# Patient Record
Sex: Female | Born: 1960 | Race: White | Hispanic: No | Marital: Married | Smoking: Former smoker
Health system: Southern US, Community
[De-identification: ages and names within clinical notes are randomized; demographics above are authoritative.]

## PROBLEM LIST (undated history)

## (undated) DIAGNOSIS — E785 Hyperlipidemia, unspecified: Secondary | ICD-10-CM

## (undated) DIAGNOSIS — Z8719 Personal history of other diseases of the digestive system: Secondary | ICD-10-CM

## (undated) DIAGNOSIS — T7840XA Allergy, unspecified, initial encounter: Secondary | ICD-10-CM

## (undated) DIAGNOSIS — F32A Depression, unspecified: Secondary | ICD-10-CM

## (undated) DIAGNOSIS — H269 Unspecified cataract: Secondary | ICD-10-CM

## (undated) DIAGNOSIS — F319 Bipolar disorder, unspecified: Secondary | ICD-10-CM

## (undated) DIAGNOSIS — F17200 Nicotine dependence, unspecified, uncomplicated: Secondary | ICD-10-CM

## (undated) DIAGNOSIS — F419 Anxiety disorder, unspecified: Secondary | ICD-10-CM

## (undated) DIAGNOSIS — F329 Major depressive disorder, single episode, unspecified: Secondary | ICD-10-CM

## (undated) HISTORY — DX: Anxiety disorder, unspecified: F41.9

## (undated) HISTORY — DX: Unspecified cataract: H26.9

## (undated) HISTORY — DX: Depression, unspecified: F32.A

## (undated) HISTORY — DX: Personal history of other diseases of the digestive system: Z87.19

## (undated) HISTORY — PX: POLYPECTOMY: SHX149

## (undated) HISTORY — DX: Allergy, unspecified, initial encounter: T78.40XA

## (undated) HISTORY — DX: Hyperlipidemia, unspecified: E78.5

## (undated) HISTORY — PX: EYELID LACERATION REPAIR: SHX1564

## (undated) HISTORY — DX: Major depressive disorder, single episode, unspecified: F32.9

## (undated) HISTORY — DX: Bipolar disorder, unspecified: F31.9

## (undated) HISTORY — DX: Nicotine dependence, unspecified, uncomplicated: F17.200

---
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
## 1991-08-05 HISTORY — PX: TUBAL LIGATION: SHX77

## 2006-03-09 ENCOUNTER — Ambulatory Visit: Payer: Self-pay | Admitting: Gynecology

## 2009-08-04 LAB — HM MAMMOGRAPHY

## 2010-06-04 LAB — HM PAP SMEAR

## 2010-08-04 HISTORY — PX: COLONOSCOPY: SHX174

## 2010-10-07 ENCOUNTER — Inpatient Hospital Stay (INDEPENDENT_AMBULATORY_CARE_PROVIDER_SITE_OTHER)
Admission: RE | Admit: 2010-10-07 | Discharge: 2010-10-07 | Disposition: A | Discharge status: Home / Self Care | Payer: Medicaid Other | Source: Ambulatory Visit | Attending: Family Medicine | Admitting: Family Medicine

## 2010-10-07 DIAGNOSIS — J069 Acute upper respiratory infection, unspecified: Secondary | ICD-10-CM

## 2011-01-28 ENCOUNTER — Encounter: Payer: Self-pay | Admitting: Gastroenterology

## 2011-02-07 ENCOUNTER — Ambulatory Visit (INDEPENDENT_AMBULATORY_CARE_PROVIDER_SITE_OTHER): Payer: Medicaid Other

## 2011-02-07 ENCOUNTER — Inpatient Hospital Stay (INDEPENDENT_AMBULATORY_CARE_PROVIDER_SITE_OTHER)
Admission: RE | Admit: 2011-02-07 | Discharge: 2011-02-07 | Disposition: A | Discharge status: Home / Self Care | Payer: Medicaid Other | Source: Ambulatory Visit | Attending: Family Medicine | Admitting: Family Medicine

## 2011-02-07 DIAGNOSIS — S335XXA Sprain of ligaments of lumbar spine, initial encounter: Secondary | ICD-10-CM

## 2011-02-28 ENCOUNTER — Encounter: Payer: Self-pay | Admitting: Family Medicine

## 2011-03-03 ENCOUNTER — Other Ambulatory Visit: Payer: Medicaid Other | Admitting: Gastroenterology

## 2011-03-24 ENCOUNTER — Encounter: Payer: Self-pay | Admitting: Gastroenterology

## 2011-04-04 ENCOUNTER — Other Ambulatory Visit: Payer: Medicaid Other | Admitting: Gastroenterology

## 2011-04-10 ENCOUNTER — Ambulatory Visit (AMBULATORY_SURGERY_CENTER): Payer: Medicaid Other | Admitting: *Deleted

## 2011-04-10 VITALS — Ht 62.0 in | Wt 141.0 lb

## 2011-04-10 DIAGNOSIS — Z1211 Encounter for screening for malignant neoplasm of colon: Secondary | ICD-10-CM

## 2011-04-10 MED ORDER — SUPREP BOWEL PREP KIT 17.5-3.13-1.6 GM/177ML PO SOLN: 1.0000 | ORAL | Status: DC

## 2011-04-18 ENCOUNTER — Encounter: Payer: Medicaid Other | Admitting: Gastroenterology

## 2011-04-24 ENCOUNTER — Other Ambulatory Visit: Payer: Medicaid Other | Admitting: Gastroenterology

## 2011-04-24 ENCOUNTER — Encounter: Payer: Medicaid Other | Admitting: Gastroenterology

## 2011-04-29 ENCOUNTER — Ambulatory Visit (AMBULATORY_SURGERY_CENTER): Payer: Medicaid Other | Admitting: Gastroenterology

## 2011-04-29 ENCOUNTER — Encounter: Payer: Self-pay | Admitting: Gastroenterology

## 2011-04-29 VITALS — BP 117/70 | HR 51 | Temp 97.5°F | Resp 13 | Ht 66.0 in | Wt 141.0 lb

## 2011-04-29 DIAGNOSIS — D126 Benign neoplasm of colon, unspecified: Secondary | ICD-10-CM

## 2011-04-29 DIAGNOSIS — Z1211 Encounter for screening for malignant neoplasm of colon: Secondary | ICD-10-CM

## 2011-04-29 MED ORDER — SODIUM CHLORIDE 0.9 % IV SOLN: 500.0000 mL | INTRAVENOUS | Status: DC

## 2011-04-29 NOTE — Patient Instructions (Signed)
Please review discharge instructions (blue and green sheets)  Review information about polyps  Resume your normal medications

## 2011-04-29 NOTE — Progress Notes (Signed)
Propofol was administered to the pt by Brennan Bailey, CRNA.  Maw  Pt tolerated the colonoscopy very well. maw

## 2011-04-30 ENCOUNTER — Telehealth: Payer: Self-pay | Admitting: *Deleted

## 2011-04-30 NOTE — Telephone Encounter (Signed)
Left message to call if needed. 

## 2011-05-29 LAB — HM COLONOSCOPY

## 2011-06-13 ENCOUNTER — Emergency Department (HOSPITAL_COMMUNITY)
Admission: EM | Admit: 2011-06-13 | Discharge: 2011-06-13 | Disposition: A | Discharge status: Home / Self Care | Payer: No Typology Code available for payment source | Attending: Emergency Medicine | Admitting: Emergency Medicine

## 2011-06-13 ENCOUNTER — Encounter (HOSPITAL_COMMUNITY): Payer: Self-pay | Admitting: *Deleted

## 2011-06-13 ENCOUNTER — Emergency Department (HOSPITAL_COMMUNITY): Payer: No Typology Code available for payment source

## 2011-06-13 DIAGNOSIS — M545 Low back pain, unspecified: Secondary | ICD-10-CM | POA: Insufficient documentation

## 2011-06-13 DIAGNOSIS — H409 Unspecified glaucoma: Secondary | ICD-10-CM | POA: Insufficient documentation

## 2011-06-13 DIAGNOSIS — E785 Hyperlipidemia, unspecified: Secondary | ICD-10-CM | POA: Insufficient documentation

## 2011-06-13 DIAGNOSIS — F341 Dysthymic disorder: Secondary | ICD-10-CM | POA: Insufficient documentation

## 2011-06-13 DIAGNOSIS — F319 Bipolar disorder, unspecified: Secondary | ICD-10-CM | POA: Insufficient documentation

## 2011-06-13 DIAGNOSIS — Z79899 Other long term (current) drug therapy: Secondary | ICD-10-CM | POA: Insufficient documentation

## 2011-06-13 DIAGNOSIS — S335XXA Sprain of ligaments of lumbar spine, initial encounter: Secondary | ICD-10-CM | POA: Insufficient documentation

## 2011-06-13 MED ORDER — IBUPROFEN 800 MG PO TABS: 800.0000 mg | ORAL_TABLET | Freq: Three times a day (TID) | ORAL | Status: AC | PRN

## 2011-06-13 MED ORDER — TRAMADOL HCL 50 MG PO TABS: 50.0000 mg | ORAL_TABLET | Freq: Four times a day (QID) | ORAL | Status: DC | PRN

## 2011-06-13 MED ORDER — OXYCODONE-ACETAMINOPHEN 5-325 MG PO TABS
1.0000 | ORAL_TABLET | Freq: Once | ORAL | Status: AC
  Administered 2011-06-13: 1 via ORAL
  Filled 2011-06-13: qty 1

## 2011-06-13 MED ORDER — DIAZEPAM 5 MG PO TABS: 5.0000 mg | ORAL_TABLET | Freq: Three times a day (TID) | ORAL | Status: AC | PRN

## 2011-06-13 MED ORDER — TRAMADOL HCL 50 MG PO TABS: 50.0000 mg | ORAL_TABLET | Freq: Four times a day (QID) | ORAL | Status: AC | PRN
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
## 2011-06-13 NOTE — ED Provider Notes (Signed)
Evaluation and management procedures were performed by the PA/NP under my supervision/collaboration.   Janet Humphreys, MD 06/13/11 2023 
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
## 2011-06-30 ENCOUNTER — Emergency Department (HOSPITAL_COMMUNITY)
Admission: EM | Admit: 2011-06-30 | Discharge: 2011-07-01 | Disposition: A | Discharge status: Home / Self Care | Payer: No Typology Code available for payment source | Attending: Emergency Medicine | Admitting: Emergency Medicine

## 2011-06-30 ENCOUNTER — Encounter (HOSPITAL_COMMUNITY): Payer: Self-pay | Admitting: Emergency Medicine

## 2011-06-30 DIAGNOSIS — S139XXA Sprain of joints and ligaments of unspecified parts of neck, initial encounter: Secondary | ICD-10-CM | POA: Insufficient documentation

## 2011-06-30 DIAGNOSIS — M549 Dorsalgia, unspecified: Secondary | ICD-10-CM | POA: Insufficient documentation

## 2011-06-30 DIAGNOSIS — Z79899 Other long term (current) drug therapy: Secondary | ICD-10-CM | POA: Insufficient documentation

## 2011-06-30 DIAGNOSIS — H409 Unspecified glaucoma: Secondary | ICD-10-CM | POA: Insufficient documentation

## 2011-06-30 DIAGNOSIS — T148XXA Other injury of unspecified body region, initial encounter: Secondary | ICD-10-CM

## 2011-06-30 DIAGNOSIS — E785 Hyperlipidemia, unspecified: Secondary | ICD-10-CM | POA: Insufficient documentation

## 2011-06-30 DIAGNOSIS — F3130 Bipolar disorder, current episode depressed, mild or moderate severity, unspecified: Secondary | ICD-10-CM | POA: Insufficient documentation
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
## 2011-07-01 MED ORDER — OXYCODONE-ACETAMINOPHEN 5-325 MG PO TABS
1.0000 | ORAL_TABLET | Freq: Once | ORAL | Status: AC
  Administered 2011-07-01: 1 via ORAL
  Filled 2011-07-01: qty 1

## 2011-07-01 MED ORDER — NAPROXEN 375 MG PO TABS: 375.0000 mg | ORAL_TABLET | Freq: Two times a day (BID) | ORAL | Status: DC

## 2011-07-01 MED ORDER — DIAZEPAM 5 MG PO TABS
5.0000 mg | ORAL_TABLET | Freq: Once | ORAL | Status: AC
  Administered 2011-07-01: 5 mg via ORAL
  Filled 2011-07-01: qty 1

## 2011-07-01 MED ORDER — IBUPROFEN 200 MG PO TABS
400.0000 mg | ORAL_TABLET | Freq: Once | ORAL | Status: AC
  Administered 2011-07-01: 400 mg via ORAL
  Filled 2011-07-01: qty 2

## 2011-07-01 MED ORDER — CYCLOBENZAPRINE HCL 10 MG PO TABS: 5.0000 mg | ORAL_TABLET | Freq: Three times a day (TID) | ORAL | Status: AC | PRN
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
## 2011-09-08 ENCOUNTER — Emergency Department (HOSPITAL_COMMUNITY): Payer: Medicaid Other

## 2011-09-08 ENCOUNTER — Encounter (HOSPITAL_COMMUNITY): Payer: Self-pay | Admitting: Emergency Medicine

## 2011-09-08 ENCOUNTER — Emergency Department (HOSPITAL_COMMUNITY)
Admission: EM | Admit: 2011-09-08 | Discharge: 2011-09-08 | Disposition: A | Discharge status: Home / Self Care | Payer: Medicaid Other | Attending: Emergency Medicine | Admitting: Emergency Medicine

## 2011-09-08 DIAGNOSIS — M7989 Other specified soft tissue disorders: Secondary | ICD-10-CM | POA: Insufficient documentation

## 2011-09-08 DIAGNOSIS — S93609A Unspecified sprain of unspecified foot, initial encounter: Secondary | ICD-10-CM | POA: Insufficient documentation

## 2011-09-08 DIAGNOSIS — F319 Bipolar disorder, unspecified: Secondary | ICD-10-CM | POA: Insufficient documentation

## 2011-09-08 DIAGNOSIS — F17200 Nicotine dependence, unspecified, uncomplicated: Secondary | ICD-10-CM | POA: Insufficient documentation

## 2011-09-08 DIAGNOSIS — M79609 Pain in unspecified limb: Secondary | ICD-10-CM | POA: Insufficient documentation

## 2011-09-08 DIAGNOSIS — R51 Headache: Secondary | ICD-10-CM | POA: Insufficient documentation

## 2011-09-08 DIAGNOSIS — X500XXA Overexertion from strenuous movement or load, initial encounter: Secondary | ICD-10-CM | POA: Insufficient documentation

## 2011-09-08 DIAGNOSIS — S93602A Unspecified sprain of left foot, initial encounter: Secondary | ICD-10-CM

## 2011-09-08 DIAGNOSIS — E785 Hyperlipidemia, unspecified: Secondary | ICD-10-CM | POA: Insufficient documentation

## 2011-09-08 DIAGNOSIS — W010XXA Fall on same level from slipping, tripping and stumbling without subsequent striking against object, initial encounter: Secondary | ICD-10-CM | POA: Insufficient documentation

## 2011-09-08 DIAGNOSIS — H409 Unspecified glaucoma: Secondary | ICD-10-CM | POA: Insufficient documentation

## 2011-09-08 MED ORDER — ONDANSETRON 4 MG PO TBDP
8.0000 mg | ORAL_TABLET | Freq: Once | ORAL | Status: AC
  Administered 2011-09-08: 8 mg via ORAL
  Filled 2011-09-08: qty 2

## 2011-09-08 MED ORDER — OXYCODONE-ACETAMINOPHEN 5-325 MG PO TABS
1.0000 | ORAL_TABLET | Freq: Once | ORAL | Status: AC
  Administered 2011-09-08: 1 via ORAL
  Filled 2011-09-08: qty 1

## 2011-09-08 MED ORDER — ONDANSETRON 8 MG PO TBDP: 8.0000 mg | ORAL_TABLET | Freq: Once | ORAL | Status: AC

## 2011-09-08 MED ORDER — OXYCODONE-ACETAMINOPHEN 5-325 MG PO TABS: 1.0000 | ORAL_TABLET | Freq: Once | ORAL | Status: AC

## 2011-09-08 NOTE — ED Notes (Signed)
Pt given an ice pack. Paged ortho

## 2011-09-08 NOTE — Progress Notes (Signed)
Orthopedic Tech Progress Note Patient Details:  Natalie [name] 23-Aug-1960 [id number]  Other Ortho Devices Type of Ortho Device: Crutches Ortho Device Location: ace wrap foot Ortho Device Interventions: Application;Adjustment   Shawnie Pons 09/08/2011, 8:27 AM
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
## 2011-09-08 NOTE — ED Notes (Signed)
Patient fell into a hole last night, outside and now having right foot pain.  Swelling noted to top of foot.

## 2011-09-08 NOTE — ED Notes (Signed)
Ortho returned page, coming down to see pt

## 2011-09-08 NOTE — ED Notes (Signed)
At discharge pt alert and oriented. Pt using crutches, ace wrap applied by ortho tech. Pt wheeled out by NT

## 2011-09-08 NOTE — ED Provider Notes (Signed)
Medical screening examination/treatment/procedure(s) were performed by non-physician practitioner and as supervising physician I was immediately available for consultation/collaboration.  Doug Sou, MD 09/08/11 [phone]

## 2011-09-08 NOTE — ED Notes (Signed)
Patient states twisted foot last night during the [place] game but it did not hurt very much until it woke her up at 0130 this am. States unable to put any weight on her foot to walk. Able to move toes swelling noted to top of foot but states pain is worse on the bottem of foot. Rates pain 10/10

## 2012-03-27 ENCOUNTER — Emergency Department (INDEPENDENT_AMBULATORY_CARE_PROVIDER_SITE_OTHER)
Admission: EM | Admit: 2012-03-27 | Discharge: 2012-03-27 | Disposition: A | Discharge status: Nursing Home (Includes ICF) | Payer: Medicaid Other | Source: Home / Self Care | Attending: Emergency Medicine | Admitting: Emergency Medicine

## 2012-03-27 ENCOUNTER — Encounter (HOSPITAL_COMMUNITY): Payer: Self-pay | Admitting: Emergency Medicine

## 2012-03-27 ENCOUNTER — Observation Stay (HOSPITAL_COMMUNITY)
Admission: EM | Admit: 2012-03-27 | Discharge: 2012-03-28 | Disposition: A | Discharge status: Home / Self Care | Payer: Medicaid Other | Attending: Emergency Medicine | Admitting: Emergency Medicine

## 2012-03-27 ENCOUNTER — Encounter (HOSPITAL_COMMUNITY): Payer: Self-pay | Admitting: Family Medicine

## 2012-03-27 DIAGNOSIS — L0291 Cutaneous abscess, unspecified: Secondary | ICD-10-CM

## 2012-03-27 DIAGNOSIS — F319 Bipolar disorder, unspecified: Secondary | ICD-10-CM | POA: Insufficient documentation

## 2012-03-27 DIAGNOSIS — L02419 Cutaneous abscess of limb, unspecified: Secondary | ICD-10-CM

## 2012-03-27 DIAGNOSIS — F17200 Nicotine dependence, unspecified, uncomplicated: Secondary | ICD-10-CM | POA: Insufficient documentation

## 2012-03-27 DIAGNOSIS — IMO0002 Reserved for concepts with insufficient information to code with codable children: Principal | ICD-10-CM | POA: Insufficient documentation

## 2012-03-27 DIAGNOSIS — E785 Hyperlipidemia, unspecified: Secondary | ICD-10-CM | POA: Insufficient documentation

## 2012-03-27 DIAGNOSIS — L0390 Cellulitis, unspecified: Secondary | ICD-10-CM

## 2012-03-27 LAB — COMPREHENSIVE METABOLIC PANEL
ALT: 20 U/L (ref 0–35)
AST: 27 U/L (ref 0–37)
Albumin: 3.6 g/dL (ref 3.5–5.2)
Alkaline Phosphatase: 97 U/L (ref 39–117)
BUN: 8 mg/dL (ref 6–23)
CO2: 28 mEq/L (ref 19–32)
Calcium: 9.8 mg/dL (ref 8.4–10.5)
Chloride: 99 mEq/L (ref 96–112)
Creatinine, Ser: 0.87 mg/dL (ref 0.50–1.10)
GFR calc Af Amer: 87 mL/min — ABNORMAL LOW (ref >90)
GFR calc non Af Amer: 75 mL/min — ABNORMAL LOW (ref >90)
Glucose, Bld: 76 mg/dL (ref 70–99)
Potassium: 3 mEq/L — ABNORMAL LOW (ref 3.5–5.1)
Sodium: 139 mEq/L (ref 135–145)
Total Bilirubin: 0.2 mg/dL — ABNORMAL LOW (ref 0.3–1.2)
Total Protein: 7.4 g/dL (ref 6.0–8.3)

## 2012-03-27 LAB — CBC WITH DIFFERENTIAL/PLATELET
Basophils Absolute: 0 10*3/uL (ref 0.0–0.1)
Basophils Relative: 0 % (ref 0–1)
Eosinophils Absolute: 0.2 10*3/uL (ref 0.0–0.7)
Eosinophils Relative: 2 % (ref 0–5)
HCT: 35.5 % — ABNORMAL LOW (ref 36.0–46.0)
Hemoglobin: 12.3 g/dL (ref 12.0–15.0)
Lymphocytes Relative: 21 % (ref 12–46)
Lymphs Abs: 3 10*3/uL (ref 0.7–4.0)
MCH: 32.5 pg (ref 26.0–34.0)
MCHC: 34.6 g/dL (ref 30.0–36.0)
MCV: 93.7 fL (ref 78.0–100.0)
Monocytes Absolute: 1 10*3/uL (ref 0.1–1.0)
Monocytes Relative: 7 % (ref 3–12)
Neutro Abs: 10.2 10*3/uL — ABNORMAL HIGH (ref 1.7–7.7)
Neutrophils Relative %: 71 % (ref 43–77)
Platelets: 239 10*3/uL (ref 150–400)
RBC: 3.79 MIL/uL — ABNORMAL LOW (ref 3.87–5.11)
RDW: 12.9 % (ref 11.5–15.5)
WBC: 14.4 10*3/uL — ABNORMAL HIGH (ref 4.0–10.5)

## 2012-03-27 MED ORDER — CLINDAMYCIN PHOSPHATE 600 MG/50ML IV SOLN
600.0000 mg | Freq: Three times a day (TID) | INTRAVENOUS | Status: DC
  Administered 2012-03-28 (×2): 600 mg via INTRAVENOUS
  Filled 2012-03-27 (×2): qty 50

## 2012-03-27 MED ORDER — CLINDAMYCIN PHOSPHATE 900 MG/50ML IV SOLN
900.0000 mg | Freq: Once | INTRAVENOUS | Status: AC
  Administered 2012-03-27: 900 mg via INTRAVENOUS
  Filled 2012-03-27: qty 50

## 2012-03-27 MED ORDER — ACETAMINOPHEN 325 MG PO TABS: 650.0000 mg | ORAL_TABLET | ORAL | Status: DC | PRN

## 2012-03-27 MED ORDER — QUETIAPINE FUMARATE 50 MG PO TABS: 100.0000 mg | ORAL_TABLET | Freq: Every day | ORAL | Status: DC

## 2012-03-27 MED ORDER — POTASSIUM CHLORIDE CRYS ER 20 MEQ PO TBCR
40.0000 meq | EXTENDED_RELEASE_TABLET | Freq: Once | ORAL | Status: AC
  Administered 2012-03-27: 40 meq via ORAL
  Filled 2012-03-27: qty 2

## 2012-03-27 MED ORDER — CLINDAMYCIN PHOSPHATE 600 MG/50ML IV SOLN: 600.0000 mg | Freq: Three times a day (TID) | INTRAVENOUS | Status: DC

## 2012-03-27 MED ORDER — SIMVASTATIN 20 MG PO TABS
20.0000 mg | ORAL_TABLET | Freq: Every day | ORAL | Status: DC
  Filled 2012-03-27 (×2): qty 1

## 2012-03-27 NOTE — ED Provider Notes (Signed)
No chief complaint on file.   History of Present Illness:    Natalie [name] is a 51 year old female who has had a ten-day history of a painful boil in her right axilla. She saw her primary care physician at [facility] this past Monday, 6 days ago. At that time the boil was incised and drained, but no pus resulted. A culture was obtained, but were unable to get the results right now. She was placed on doxycycline 100 mg twice a day. Ever since then the pain has been getting worse. There is swelling and redness and she has trouble moving her right arm. The swelling and erythema extends to lateral chest wall and to her breast. She's felt feverish and chilled. She's never had anything like this before. No history of MRSA or diabetes. She does have bipolar disorder and hyperlipidemia. She's on medication for that. She is allergic to Septra.  Review of Systems:  Other than noted above, the patient denies any of the following symptoms: Systemic:  No fever, chills or sweats. Skin:  No rash or itching.  PMFSH:  Past medical history, family history, social history, meds, and allergies were reviewed.  No history of diabetes or prior history of abscesses or MRSA.  Physical Exam:   Vital signs:  BP 111/54  Pulse 80  Temp 98.4 F (36.9 C) (Oral)  Resp 17  SpO2 98%  LMP 11/16/2009 Skin:  She has a healed I&D in her right axilla. There is diffuse erythema and swelling extending from the axilla into the upper arm, down onto the lateral chest wall area and into the right breast. This is all swollen, and tender, and erythematous. There is no localized area of fluctuance and no drainage.  Skin exam was otherwise normal.  No rash. Ext:  Distal pulses were full, patient has full ROM of all joints. Except that she has had a decrease in abduction of her shoulder.  Assessment:  The primary encounter diagnosis was Abscess. A diagnosis of Cellulitis was also pertinent to this visit. This patient has extensive  cellulitis extending to the lateral chest wall, the upper arm, and even into the breast. She has failed outpatient management including incision and drainage and antibiotics. At this point I believe she needs hospital evaluation and treatment.  Plan:   1.  The following meds were prescribed:   New Prescriptions   No medications on file   2.  The patient was transported to the emergency department via shuttle.  Reuben Likes, MD 03/27/12 [phone]

## 2012-03-27 NOTE — ED Notes (Signed)
I gave the patient a cup of ice, a coke, two containers of apple sauce, and two packs of graham crackers.

## 2012-03-27 NOTE — ED Provider Notes (Signed)
Assumed pt care to CDU for cellulitis protocol.  51 year old female who presents to PMD office last week for evaluation of abscess to her R axillary but had a dry tap.  Pt was placed on doxy.  Pt is here with increase redness consistent with R axillary cellulitis.  Dr. Silverio Lay performed I&D and able to evacuate a moderate amount of pustular discharge.  Pt will receive IV clindamycin.  Plan to reassess tomorrow morning.  If not better, consider admission.    8:10 PM On evaluation, pt has dressing to R axillary region, ttp.  Mild erythema streaks extending toward lateral aspect of R breast with mild warmth.  Pt appears non toxic, currently tolerating PO.  Will continue cellulitis protocol.    10:35 PM Pt request nightly medication including seroquel and pravacol, will order.  11:45 PM Dr. Ranae Palms is aware of plan and will continue to monitor over night.  Pt to be reassess in AM, if better will d/c with clindamycin and appropriate follow up.    Fayrene Helper, PA-C 03/27/12 2346

## 2012-03-27 NOTE — ED Notes (Signed)
I gave the patient a cup of ice and a coke. 

## 2012-03-27 NOTE — ED Notes (Signed)
I gave the patient a warm blanket. 

## 2012-03-27 NOTE — ED Notes (Signed)
Extremity Restriction band placed on right arm.

## 2012-03-27 NOTE — ED Notes (Signed)
Pt sts area under right arm that was lanced by her doctor the other day. sts the abcess has gotten better but now there is a large red area around the abscess moving under her right breast

## 2012-03-27 NOTE — ED Notes (Signed)
DELAY SECONDARY TO ASSIGNMENT ACUITY

## 2012-03-27 NOTE — ED Notes (Signed)
Dr. Silverio Lay at bedside performing I&D of R axillary abscess. Pt tolerating without difficulty at the time.

## 2012-03-27 NOTE — ED Notes (Signed)
I gave the patient a happy meal. 

## 2012-03-27 NOTE — ED Notes (Signed)
SEEN ON Monday FOR ABSCESS RIGHT AXILLA.  TREATED WITH DOXYCYCLINE

## 2012-03-27 NOTE — ED Provider Notes (Signed)
History  This chart was scribed for Natalie Canal, MD by Erskine Emery. This patient was seen in room TR08C/TR08C and the patient's care was started at 17:15.   CSN: [id number]  Arrival date & time 03/27/12  1616   None     Chief Complaint  Patient presents with  . Abscess    (Consider location/radiation/quality/duration/timing/severity/associated sxs/prior treatment) The history is provided by the patient. No language interpreter was used.  Jackquelyn Sundberg is a 51 y.o. female who presents to the Emergency Department complaining of a painful abscess under the right axilla for the past week. Pt reports she saw her PCP (Dr. Tanya Nones) on Monday for the same complaint and was treated with incision and drainage and doxycyclin. Then the pt went to urgent care today because her symptoms did not decrease and she was told to come here.  Pt has no h/o similar symptoms.  Past Medical History  Diagnosis Date  . Bipolar 1 disorder   . Smoker   . Allergy     seasonal  . Anxiety     bipolar  . Depression   . Glaucoma     stage 2  . Hyperlipidemia     Past Surgical History  Procedure Date  . Tubal ligation 1993  . Cosmetic surgery 1967    face after accident    Family History  Problem Relation Age of Onset  . Colon cancer Maternal Grandfather     History  Substance Use Topics  . Smoking status: Current Everyday Smoker -- 1.0 packs/day    Types: Cigarettes  . Smokeless tobacco: Never Used  . Alcohol Use: No    OB History    Grav Para Term Preterm Abortions TAB SAB Ect Mult Living                  Review of Systems  Constitutional: Negative for fever and chills.  Respiratory: Negative for shortness of breath.   Gastrointestinal: Negative for nausea and vomiting.  Musculoskeletal:       Abscess under right breast  Skin: Positive for wound.  Neurological: Negative for weakness.    Allergies  Codeine; Hydrocodone; and Sulfa antibiotics  Home Medications   Current  Outpatient Rx  Name Route Sig Dispense Refill  . ALPRAZOLAM 1 MG PO TABS Oral Take 1 mg by mouth 2 (two) times daily as needed. For anxiety    . LAMOTRIGINE 100 MG PO TABS Oral Take 100 mg by mouth daily.     . CENTRUM SILVER ULTRA WOMENS PO TABS Oral Take 1 tablet by mouth daily.      Marland Kitchen PAROXETINE HCL 20 MG PO TABS Oral Take 20 mg by mouth every morning.      Marland Kitchen PRAVASTATIN SODIUM 40 MG PO TABS Oral Take 40 mg by mouth every evening.     . PSYLLIUM 58.6 % PO PACK Oral Take 1 packet by mouth daily.      . QUETIAPINE FUMARATE 100 MG PO TABS Oral Take 100-200 mg by mouth at bedtime. May take 1-2 if needed for sleep      BP 109/55  Pulse 80  Temp 98.5 F (36.9 C) (Oral)  Resp 18  SpO2 96%  LMP 11/16/2009  Physical Exam  Nursing note and vitals reviewed. Constitutional: She is oriented to person, place, and time. She appears well-developed and well-nourished. No distress.  HENT:  Head: Normocephalic and atraumatic.  Eyes: EOM are normal.  Neck: Neck supple. No tracheal deviation present.  Cardiovascular:  Normal rate.   Pulmonary/Chest: Effort normal. No respiratory distress.         Right axilla: a fluctuant mass with over lying cellulitis that spreads to right breast area.   Musculoskeletal: Normal range of motion.  Neurological: She is alert and oriented to person, place, and time.  Skin: Skin is warm and dry.  Psychiatric: She has a normal mood and affect. Her behavior is normal.    ED Course  Procedures (including critical care time) DIAGNOSTIC STUDIES: Oxygen Saturation is 96% on room air, adequate by my interpretation.    COORDINATION OF CARE: 17:38--I evaluated the patient and we discussed a treatment plan including ultrasound, IV antibiotics, labs, and possible admission for monitoring to which the pt agreed.   18:15--I performed the ultrasound.  18:55--I performed incision and drainage.  INCISION AND DRAINAGE Performed by: Silverio Lay, Keean Wilmeth Consent: Verbal consent  obtained. Risks and benefits: risks, benefits and alternatives were discussed Type: abscess  Body area: R axilla  Anesthesia: local infiltration  Local anesthetic: lidocaine 2% with epinephrine  Anesthetic total: 15 ml  Complexity: complex Blunt dissection to break up loculations  Drainage: purulent  Drainage amount: 4cc  Packing material: 1/4 in plain gauze  Patient tolerance: Patient tolerated the procedure well with no immediate complications.     Labs Reviewed  CBC WITH DIFFERENTIAL - Abnormal; Notable for the following:    WBC 14.4 (*)     RBC 3.79 (*)     HCT 35.5 (*)     Neutro Abs 10.2 (*)     All other components within normal limits  COMPREHENSIVE METABOLIC PANEL - Abnormal; Notable for the following:    Potassium 3.0 (*)     Total Bilirubin 0.2 (*)     GFR calc non Af Amer 75 (*)     GFR calc Af Amer 87 (*)     All other components within normal limits   No results found.   No diagnosis found.    MDM  Nate Perri is a 51 y.o. female here with R axillary abscess s/p I&D. She will benefit from short course of IV abx. Given clinda IV. WBC 14, CMP showed K 3.0. Care transferred to CDU PA.    This document was completed by the scribe at my direction and I have reviewed its accuracy. I have personally examined the patient and agrees with the above document.   Chaney Malling, MD     Natalie Canal, MD 03/27/12 [phone]

## 2012-03-28 MED ORDER — HYDROCODONE-ACETAMINOPHEN 5-325 MG PO TABS: 1.0000 | ORAL_TABLET | ORAL | Status: DC | PRN

## 2012-03-28 MED ORDER — CLINDAMYCIN HCL 300 MG PO CAPS: 300.0000 mg | ORAL_CAPSULE | Freq: Four times a day (QID) | ORAL | Status: DC

## 2012-03-28 MED ORDER — PROMETHAZINE HCL 25 MG RE SUPP: 25.0000 mg | Freq: Four times a day (QID) | RECTAL | Status: DC | PRN

## 2012-03-28 NOTE — Discharge Instructions (Signed)
You have been seen for your complaint of right armpit infection. He will be discharged with oral antibiotics, pain medication, and anti-nausea medication. Please take all of your antibiotics until finished!   You may develop abdominal discomfort or diarrhea from the antibiotic.  You may help offset this with probiotics which you can buy or get in yogurt. Do not eat  or take the probiotics until 2 hours after your antibiotic.   SEEK MEDICAL CARE IF:  The area of redness (inflammation) is spreading, there are red streaks coming from the infected site, or if a part of the infection begins to turn dark in color.  The joint or bone underneath the infected skin becomes painful after the skin has healed.  The infection returns in the same or another area after it seems to have gone away.  A boil or bump swells up. This may be an abscess.  New, unexplained problems such as pain or fever develop.  SEEK IMMEDIATE MEDICAL CARE IF:  You have a fever.  You or your child feels drowsy or lethargic.  There is vomiting, diarrhea, or lasting discomfort or feeling ill (malaise) with muscle aches and pains.  SEEK MEDICAL CARE IF:  You develop increased pain, swelling, redness, drainage, or bleeding in the wound site.  You develop signs of generalized infection including muscle aches, chills, fever, or a general ill feeling.  You have an oral temperature above 102 F (38.9 C). Abscess An abscess (boil or furuncle) is an infected area that contains a collection of pus.  SYMPTOMS Signs and symptoms of an abscess include pain, tenderness, redness, or hardness. You may feel a moveable soft area under your skin. An abscess can occur anywhere in the body.  TREATMENT  A surgical cut (incision) may be made over your abscess to drain the pus. Gauze may be packed into the space or a drain may be looped through the abscess cavity (pocket). This provides a drain that will allow the cavity to heal from the inside outwards. The  abscess may be painful for a few days, but should feel much better if it was drained.  Your abscess, if seen early, may not have localized and may not have been drained. If not, another appointment may be required if it does not get better on its own or with medications. HOME CARE INSTRUCTIONS   Only take over-the-counter or prescription medicines for pain, discomfort, or fever as directed by your caregiver.   Take your antibiotics as directed if they were prescribed. Finish them even if you start to feel better.   Keep the skin and clothes clean around your abscess.   If the abscess was drained, you will need to use gauze dressing to collect any draining pus. Dressings will typically need to be changed 3 or more times a day.   The infection may spread by skin contact with others. Avoid skin contact as much as possible.   Practice good hygiene. This includes regular hand washing, cover any draining skin lesions, and do not share personal care items.   If you participate in sports, do not share athletic equipment, towels, whirlpools, or personal care items. Shower after every practice or tournament.   If a draining area cannot be adequately covered:   Do not participate in sports.   Children should not participate in day care until the wound has healed or drainage stops.   If your caregiver has given you a follow-up appointment, it is very important to keep that  appointment. Not keeping the appointment could result in a much worse infection, chronic or permanent injury, pain, and disability. If there is any problem keeping the appointment, you must call back to this facility for assistance.  SEEK MEDICAL CARE IF:   You develop increased pain, swelling, redness, drainage, or bleeding in the wound site.   You develop signs of generalized infection including muscle aches, chills, fever, or a general ill feeling.   You have an oral temperature above 102 F (38.9 C).  MAKE SURE YOU:    Understand these instructions.   Will watch your condition.   Will get help right away if you are not doing well or get worse.  Document Released: 04/30/2005 Document Revised: 07/10/2011 Document Reviewed: 02/22/2008 [facility] Patient Information 2012 [place], [place].

## 2012-03-28 NOTE — ED Provider Notes (Signed)
7:58 AM BP 94/56  Pulse 63  Temp 98.2 F (36.8 C) (Oral)  Resp 16  SpO2 96%  LMP 11/16/2009 Assumed care of patient in the CDU. She is here for cellulitis protocol. She came in with complaint of right axillary abscess. It was incised and drained by her primary care physician earlier this week. She was treated with doxycycline. Was seen by an outpatient care provider for increased redness and pain in the right axilla status post I&D and antibiotic treatment. She was sent to the emergency department by that provider. Labs showed elevated white count of 14.4. They also showed hypokalemia to 3.0 which was treated with by mouth potassium. She has received IV clindamycin. CV: RRR, No M/R/G, Peripheral pulses intact. No peripheral edema. Lungs: CTAB Abd: Soft, Non tender, non distended Patient states that she is feeling much better this morning. Cellulitis was on marked, however patient states that redness has resolved significantly since yesterday. Site of incision is bandaged at this time. There is blood and serous sanguinous discharge. Patient states that she does not need any pain medication at this time.  11:31 AM I spoke with the patient he states that she's feeling much better. She requests that we change her bandaging before she leaves. I am going to mark the area said that her outpatient followup has an objective way to assess resolving cellulitis. We'll discharge the patient with by mouth clindamycin. I will also give her pain medication and antinausea medication and she states she often gets sick with pain control meds. All questions have been answered fully. Patient is safe for discharge.Discussed reasons to seek immediate care. Patient expresses understanding and agrees with plan.   Arthor Captain, PA-C 03/28/12 1218

## 2012-03-28 NOTE — ED Notes (Signed)
Rt. Axilla dressing removed.  Dressing had a large amount of serosanqeous drainage. Applied new sterile dressing.  Instructed pt. On dressing changes.  PT. Verbalized understanding of dressings

## 2012-03-28 NOTE — ED Provider Notes (Signed)
Medical screening examination/treatment/procedure(s) were performed by non-physician practitioner and as supervising physician I was immediately available for consultation/collaboration.   Richardean Canal, MD 03/28/12 1450

## 2012-03-28 NOTE — ED Notes (Signed)
Pt. Given toothbrush and toothpaste. Able to ambulate to BR

## 2012-03-31 NOTE — ED Provider Notes (Signed)
Medical screening examination/treatment/procedure(s) were performed by non-physician practitioner and as supervising physician I was immediately available for consultation/collaboration.   Loren Racer, MD 03/31/12 [phone]

## 2012-04-03 ENCOUNTER — Encounter (HOSPITAL_COMMUNITY): Payer: Self-pay | Admitting: *Deleted

## 2012-04-03 ENCOUNTER — Emergency Department (HOSPITAL_COMMUNITY)
Admission: EM | Admit: 2012-04-03 | Discharge: 2012-04-03 | Disposition: A | Discharge status: Home / Self Care | Payer: Medicaid Other | Attending: Emergency Medicine | Admitting: Emergency Medicine

## 2012-04-03 ENCOUNTER — Emergency Department (HOSPITAL_COMMUNITY): Payer: Medicaid Other

## 2012-04-03 DIAGNOSIS — L0390 Cellulitis, unspecified: Secondary | ICD-10-CM

## 2012-04-03 DIAGNOSIS — F17200 Nicotine dependence, unspecified, uncomplicated: Secondary | ICD-10-CM | POA: Insufficient documentation

## 2012-04-03 DIAGNOSIS — F319 Bipolar disorder, unspecified: Secondary | ICD-10-CM | POA: Insufficient documentation

## 2012-04-03 DIAGNOSIS — F411 Generalized anxiety disorder: Secondary | ICD-10-CM | POA: Insufficient documentation

## 2012-04-03 DIAGNOSIS — L0291 Cutaneous abscess, unspecified: Secondary | ICD-10-CM

## 2012-04-03 DIAGNOSIS — E785 Hyperlipidemia, unspecified: Secondary | ICD-10-CM | POA: Insufficient documentation

## 2012-04-03 DIAGNOSIS — IMO0002 Reserved for concepts with insufficient information to code with codable children: Secondary | ICD-10-CM | POA: Insufficient documentation

## 2012-04-03 MED ORDER — FENTANYL CITRATE 0.05 MG/ML IJ SOLN
50.0000 ug | INTRAMUSCULAR | Status: DC | PRN
  Administered 2012-04-03: 50 ug via INTRAVENOUS
  Filled 2012-04-03: qty 2

## 2012-04-03 MED ORDER — OXYCODONE-ACETAMINOPHEN 5-325 MG PO TABS: 1.0000 | ORAL_TABLET | Freq: Four times a day (QID) | ORAL | Status: AC | PRN

## 2012-04-03 NOTE — ED Provider Notes (Signed)
History  This chart was scribed for Natalie Kaplan, MD by Erskine Emery. This patient was seen in room TR05C/TR05C and the patient's care was started at 11:25.   CSN: [id number]  Arrival date & time 04/03/12  1101   None     Chief Complaint  Patient presents with  . Abscess    (Consider location/radiation/quality/duration/timing/severity/associated sxs/prior treatment) The history is provided by the patient. No language interpreter was used.   Natalie [name] is a 51 y.o. female who presents to the Emergency Department complaining of a gradually worsening abscess in the right axilla for the past week and a half with associated nausea, emesis, chills, loss of appetite, sleep disturbance, and possible fever (pt has not checked her temperature but has been feeling hot). Pt has been here and to see her her PCP (Dr. Tanya Nones at [facility]) several times this week for the same complaint. Pt was treated with 3 different incision and drainage procedures (that drained blood and puss) and antibiotics (clindamycin). Pt reports she has been taking the clindamycin as prescribed by the ED physician (x4/day) and has just been written for another 7 days of clindamycin by her PCP. Pt denies any h/o abscesses and has never experienced similar symptoms. Pt does have a h/o high cholesterol for which she takes Pravastatin, and Bipolar 1 disorder, for which she takes Paxil, Seroquel, and sometimes Xanax. Pt is not taking lithium. Pt is otherwise healthy and denies any h/o DM or IV drug usage.  Pt reports she has not had a menstral period in 2 years. Pt is allergic to Aspirin, Codeine, Hydrocodone, Sulfa Antibiotics, and Tylenol and reports aleve gives her diarrhea.   Past Medical History  Diagnosis Date  . Bipolar 1 disorder   . Smoker   . Allergy     seasonal  . Anxiety     bipolar  . Depression   . Glaucoma     stage 2  . Hyperlipidemia     Past Surgical History  Procedure Date  .  Tubal ligation 1993  . Cosmetic surgery 1967    face after accident    Family History  Problem Relation Age of Onset  . Colon cancer Maternal Grandfather     History  Substance Use Topics  . Smoking status: Current Everyday Smoker -- 1.0 packs/day    Types: Cigarettes  . Smokeless tobacco: Never Used  . Alcohol Use: No    OB History    Grav Para Term Preterm Abortions TAB SAB Ect Mult Living                  Review of Systems  Constitutional: Positive for chills and appetite change.  Respiratory: Negative for shortness of breath.   Gastrointestinal: Positive for nausea and vomiting.  Skin:       Abscess in the right axilla  Neurological: Negative for weakness.  Psychiatric/Behavioral: Positive for disturbed wake/sleep cycle.    Allergies  Aspirin; Codeine; Hydrocodone; Sulfa antibiotics; and Tylenol  Home Medications   Current Outpatient Rx  Name Route Sig Dispense Refill  . ALPRAZOLAM 1 MG PO TABS Oral Take 1 mg by mouth 2 (two) times daily as needed. For anxiety    . CLINDAMYCIN HCL 300 MG PO CAPS Oral Take 1 capsule (300 mg total) by mouth 4 (four) times daily. X 7 days 28 capsule 0  . HYDROCODONE-ACETAMINOPHEN 5-325 MG PO TABS Oral Take 1-2 tablets by mouth every 4 (four) hours as needed for pain. 20  tablet 0  . LAMOTRIGINE 100 MG PO TABS Oral Take 100 mg by mouth daily.     . CENTRUM SILVER ULTRA WOMENS PO TABS Oral Take 1 tablet by mouth daily.      Marland Kitchen PAROXETINE HCL 20 MG PO TABS Oral Take 20 mg by mouth every morning.      Marland Kitchen PRAVASTATIN SODIUM 40 MG PO TABS Oral Take 40 mg by mouth every evening.     Marland Kitchen PROMETHAZINE HCL 25 MG RE SUPP Rectal Place 1 suppository (25 mg total) rectally every 6 (six) hours as needed for nausea. 12 each 0  . PSYLLIUM 58.6 % PO PACK Oral Take 1 packet by mouth daily.      . QUETIAPINE FUMARATE 100 MG PO TABS Oral Take 100-200 mg by mouth at bedtime. May take 1-2 if needed for sleep      BP 117/52  Pulse 80  Temp 98.5 F (36.9  C) (Oral)  Resp 18  SpO2 98%  LMP 11/16/2009  Physical Exam  Nursing note and vitals reviewed. Constitutional: She is oriented to person, place, and time. She appears well-developed and well-nourished. No distress.  HENT:  Head: Normocephalic and atraumatic.  Eyes: EOM are normal.  Neck: Neck supple. No tracheal deviation present.  Cardiovascular: Normal rate, regular rhythm and normal heart sounds.   No murmur heard. Pulmonary/Chest: Effort normal and breath sounds normal. No respiratory distress. She has no wheezes.       Clear to ausculation  Abdominal: Soft. There is no tenderness.  Musculoskeletal: Normal range of motion.  Neurological: She is alert and oriented to person, place, and time.  Skin: Skin is warm and dry.       In the right axilla: a 6 cm x 6cm area of induration, tender to touch. Distal to the axilla, in torso, an irregularly shaped 9 cm x 6 cm area of induration, also tender and erythematous in the center with no fluctuants. Lesion is warm to touch.  Psychiatric: She has a normal mood and affect. Her behavior is normal.    ED Course  Procedures (including critical care time) DIAGNOSTIC STUDIES: Oxygen Saturation is 98% on room air, normal by my interpretation.    COORDINATION OF CARE: 11:25--I evaluated the patient and we discussed a treatment plan including ultrasound and pain medication to which the pt agreed.   11:55--I performed the ultrasound and cleaned the wound. We discussed coordinating treatment with a surgeon.     Labs Reviewed - No data to display [place] Extrem Up Right Ltd  04/03/2012  *RADIOLOGY REPORT*  Clinical Data: Possible abscess.  Drained Friday with residual redness and swelling.  ULTRASOUND OF  RIGHT UPPER EXTREMITY SOFT TISSUES  Technique:  Ultrasound examination of the soft tissues was performed in the area of clinical concern.  Comparison:  None.  Findings: Within the area of right axillary redness and swelling, there is diffuse  subcutaneous edema, likely representing cellulitis.  An area of more focal hypoechogenicity is identified measuring 8 x 6 x 6 mm, including on image 18.  IMPRESSION: Cellulitis.  A sub centimeter focus of more focal hypoechogenicity could represent the site of prior attempted drainage.  Minimal (less than 1 ml) complex fluid or phlegmon in this area suspected. No drainable abscess.   Original Report Authenticated By: Consuello Bossier, M.D.      1. Cellulitis and abscess       MDM  Patient to CDU awaiting ultrasound of abscess under right arm. No drainable  abscess seen on ultrasound. Spoke with Dr. Rhunette Croft who would like patient to followup with her primary care doctor. Patient states she has an appointment for Tuesday. No need for surgical intervention at this time. Will discharge her with pain control and instructions to continue her antibiotics. Close return precautions discussed. Wound is clean and dry there is no active pus drainage at this time. Patient is in no apparent distress. Her vitals are stable. Patient states she took Percocet after her wisdom teeth removal and did not have any issue.  Trevor Mace, PA-C 04/03/12 1410  Trevor Mace, PA-C 04/03/12 1413

## 2012-04-03 NOTE — ED Provider Notes (Signed)
Medical screening examination/treatment/procedure(s) were conducted as a shared visit with non-physician practitioner(s) and myself.  I personally evaluated the patient during the encounter  Devian Bartolomei, MD 04/03/12 2105 

## 2012-04-03 NOTE — ED Notes (Signed)
Pt returned from US.  PA at bedside

## 2012-04-03 NOTE — ED Notes (Signed)
Rec'd report from [place], [employer] fundraiser.  Pt will be transported to CDU 3 when finished with [place].

## 2012-04-03 NOTE — ED Notes (Signed)
Pt is here with right axilla abscess that was lanced on Friday and was told she had MRSA.

## 2012-04-12 NOTE — Progress Notes (Signed)
Observation review is complete for the 03/27/2012 visit.

## 2012-07-04 ENCOUNTER — Emergency Department (HOSPITAL_COMMUNITY)
Admission: EM | Admit: 2012-07-04 | Discharge: 2012-07-05 | Disposition: A | Discharge status: Home / Self Care | Payer: Medicaid Other | Attending: Emergency Medicine | Admitting: Emergency Medicine

## 2012-07-04 ENCOUNTER — Encounter (HOSPITAL_COMMUNITY): Payer: Self-pay | Admitting: *Deleted

## 2012-07-04 ENCOUNTER — Emergency Department (HOSPITAL_COMMUNITY): Admission: EM | Admit: 2012-07-04 | Discharge: 2012-07-04 | Discharge status: Against Medical Advice | Payer: Self-pay

## 2012-07-04 DIAGNOSIS — IMO0002 Reserved for concepts with insufficient information to code with codable children: Secondary | ICD-10-CM | POA: Insufficient documentation

## 2012-07-04 DIAGNOSIS — E876 Hypokalemia: Secondary | ICD-10-CM | POA: Insufficient documentation

## 2012-07-04 DIAGNOSIS — J309 Allergic rhinitis, unspecified: Secondary | ICD-10-CM | POA: Insufficient documentation

## 2012-07-04 DIAGNOSIS — Z79899 Other long term (current) drug therapy: Secondary | ICD-10-CM | POA: Insufficient documentation

## 2012-07-04 DIAGNOSIS — R4789 Other speech disturbances: Secondary | ICD-10-CM | POA: Insufficient documentation

## 2012-07-04 DIAGNOSIS — H409 Unspecified glaucoma: Secondary | ICD-10-CM | POA: Insufficient documentation

## 2012-07-04 DIAGNOSIS — F430 Acute stress reaction: Secondary | ICD-10-CM | POA: Insufficient documentation

## 2012-07-04 DIAGNOSIS — F411 Generalized anxiety disorder: Secondary | ICD-10-CM | POA: Insufficient documentation

## 2012-07-04 DIAGNOSIS — F3160 Bipolar disorder, current episode mixed, unspecified: Secondary | ICD-10-CM | POA: Insufficient documentation

## 2012-07-04 DIAGNOSIS — F3289 Other specified depressive episodes: Secondary | ICD-10-CM | POA: Insufficient documentation

## 2012-07-04 DIAGNOSIS — F911 Conduct disorder, childhood-onset type: Secondary | ICD-10-CM | POA: Insufficient documentation

## 2012-07-04 DIAGNOSIS — F329 Major depressive disorder, single episode, unspecified: Secondary | ICD-10-CM | POA: Insufficient documentation

## 2012-07-04 DIAGNOSIS — E785 Hyperlipidemia, unspecified: Secondary | ICD-10-CM | POA: Insufficient documentation

## 2012-07-04 DIAGNOSIS — F17200 Nicotine dependence, unspecified, uncomplicated: Secondary | ICD-10-CM | POA: Insufficient documentation

## 2012-07-04 DIAGNOSIS — F319 Bipolar disorder, unspecified: Secondary | ICD-10-CM | POA: Insufficient documentation

## 2012-07-04 LAB — COMPREHENSIVE METABOLIC PANEL
ALT: 17 U/L (ref 0–35)
AST: 33 U/L (ref 0–37)
Albumin: 4.3 g/dL (ref 3.5–5.2)
Alkaline Phosphatase: 94 U/L (ref 39–117)
BUN: 10 mg/dL (ref 6–23)
CO2: 26 mEq/L (ref 19–32)
Calcium: 9.2 mg/dL (ref 8.4–10.5)
Chloride: 98 mEq/L (ref 96–112)
Creatinine, Ser: 0.99 mg/dL (ref 0.50–1.10)
GFR calc Af Amer: 74 mL/min — ABNORMAL LOW (ref >90)
GFR calc non Af Amer: 64 mL/min — ABNORMAL LOW (ref >90)
Glucose, Bld: 95 mg/dL (ref 70–99)
Potassium: 3 mEq/L — ABNORMAL LOW (ref 3.5–5.1)
Sodium: 137 mEq/L (ref 135–145)
Total Bilirubin: 0.3 mg/dL (ref 0.3–1.2)
Total Protein: 7.2 g/dL (ref 6.0–8.3)

## 2012-07-04 LAB — CBC WITH DIFFERENTIAL/PLATELET
Basophils Absolute: 0 10*3/uL (ref 0.0–0.1)
Basophils Relative: 0 % (ref 0–1)
Eosinophils Absolute: 0.1 10*3/uL (ref 0.0–0.7)
Eosinophils Relative: 1 % (ref 0–5)
HCT: 35.5 % — ABNORMAL LOW (ref 36.0–46.0)
Hemoglobin: 12.2 g/dL (ref 12.0–15.0)
Lymphocytes Relative: 22 % (ref 12–46)
Lymphs Abs: 1.8 10*3/uL (ref 0.7–4.0)
MCH: 32.3 pg (ref 26.0–34.0)
MCHC: 34.4 g/dL (ref 30.0–36.0)
MCV: 93.9 fL (ref 78.0–100.0)
Monocytes Absolute: 0.5 10*3/uL (ref 0.1–1.0)
Monocytes Relative: 6 % (ref 3–12)
Neutro Abs: 5.8 10*3/uL (ref 1.7–7.7)
Neutrophils Relative %: 71 % (ref 43–77)
Platelets: 183 10*3/uL (ref 150–400)
RBC: 3.78 MIL/uL — ABNORMAL LOW (ref 3.87–5.11)
RDW: 13.4 % (ref 11.5–15.5)
WBC: 8.2 10*3/uL (ref 4.0–10.5)

## 2012-07-04 LAB — GLUCOSE, CAPILLARY: Glucose-Capillary: 84 mg/dL (ref 70–99)

## 2012-07-04 LAB — ETHANOL: Alcohol, Ethyl (B): <11 mg/dL (ref 0–11)

## 2012-07-04 MED ORDER — ALUM & MAG HYDROXIDE-SIMETH 200-200-20 MG/5ML PO SUSP: 30.0000 mL | ORAL | Status: DC | PRN

## 2012-07-04 MED ORDER — LORAZEPAM 1 MG PO TABS: 1.0000 mg | ORAL_TABLET | Freq: Three times a day (TID) | ORAL | Status: DC | PRN

## 2012-07-04 MED ORDER — ZIPRASIDONE HCL 20 MG PO CAPS
20.0000 mg | ORAL_CAPSULE | Freq: Two times a day (BID) | ORAL | Status: DC
  Administered 2012-07-04 – 2012-07-05 (×2): 20 mg via ORAL
  Filled 2012-07-04 (×2): qty 1

## 2012-07-04 MED ORDER — NICOTINE 21 MG/24HR TD PT24
21.0000 mg | MEDICATED_PATCH | Freq: Every day | TRANSDERMAL | Status: DC
  Filled 2012-07-04 (×2): qty 1

## 2012-07-04 MED ORDER — HALOPERIDOL LACTATE 5 MG/ML IJ SOLN
20.0000 mg | Freq: Once | INTRAMUSCULAR | Status: AC
  Administered 2012-07-04: 20 mg via INTRAMUSCULAR

## 2012-07-04 MED ORDER — IBUPROFEN 600 MG PO TABS: 600.0000 mg | ORAL_TABLET | Freq: Three times a day (TID) | ORAL | Status: DC | PRN

## 2012-07-04 MED ORDER — ZOLPIDEM TARTRATE 5 MG PO TABS: 5.0000 mg | ORAL_TABLET | Freq: Every evening | ORAL | Status: DC | PRN

## 2012-07-04 NOTE — ED Notes (Addendum)
Haldol 10 mg im given each thigh byJ Aroura Vasudevan rm/P. Dowd RN. (total of 20 mg IM given)  Pt tolerated well, but remains yelling/screaming/cursing/threatening staff verbally/fighting to get out of restraints. Pt yelling about "virgin birth...I had an abortion..." Security at bedside.

## 2012-07-04 NOTE — ED Notes (Signed)
Per sheriff-family reports that the pt has been off of her meds x2 weeks and behaviors have been increasing-worse yesterday and today.  When sheriff arrived, the pt has locked herself in the truck and was pulling out her hair and eating it.

## 2012-07-04 NOTE — ED Notes (Signed)
Mae x4, pos pulses all extremities.  intermittantly yelling/cursing/threathening staff/wanting to die

## 2012-07-04 NOTE — ED Notes (Signed)
PT moved to [facility] for monitoring

## 2012-07-04 NOTE — ED Notes (Signed)
1 bag belongings placed in locker 37

## 2012-07-04 NOTE — ED Notes (Signed)
MAE x4, pos pulses all extremities, still intermittantly yelling/screaming/crusing/wanted to die/fighting restrains.

## 2012-07-04 NOTE — ED Notes (Signed)
Pt brought in by [facility] . Pt found at home in truck pulling hair out and eating it, pt loud and spitting on arrival. In handcuffs for safety. Pt with garbled thought process. Conversation going from positive to negative thoughts regarding God, her mother and other topics. Pt calling staff "whores/cunts" pt placed in 4 point restraints for safety, Dr Rosalia Hammers present. Haldol administered.

## 2012-07-04 NOTE — ED Notes (Signed)
Dr Rosalia Hammers at bedside, pt talking calmy w/ md

## 2012-07-04 NOTE — ED Provider Notes (Signed)
History     CSN: [id number]  Arrival date & time 07/04/12  1359   First MD Initiated Contact with Patient 07/04/12 1401      Chief Complaint  Patient presents with  . Medical Clearance   Level V caveat (Consider location/radiation/quality/duration/timing/severity/associated sxs/prior treatment) HPI Patient brought in by comfort [facility] Department after being called to her house. He has a history of bipolar affective disorder. They state they were called out there yesterday but she did not seem amenable to them at that time. They were called back out today and she had locked herself in the truck pulling her hair out and eating it screaming aggressively and spitting on people. Patient was transported here for further assessment. She is unable to give me any history and is cursing aggressively with compressed speech. Past Medical History  Diagnosis Date  . Bipolar 1 disorder   . Smoker   . Allergy     seasonal  . Anxiety     bipolar  . Depression   . Glaucoma     stage 2  . Hyperlipidemia     Past Surgical History  Procedure Date  . Tubal ligation 1993  . Cosmetic surgery 1967    face after accident    Family History  Problem Relation Age of Onset  . Colon cancer Maternal Grandfather     History  Substance Use Topics  . Smoking status: Current Every Day Smoker -- 1.0 packs/day    Types: Cigarettes  . Smokeless tobacco: Never Used  . Alcohol Use: No    OB History    Grav Para Term Preterm Abortions TAB SAB Ect Mult Living                  Review of Systems  Unable to perform ROS   Allergies  Sulfa antibiotics; Aspirin; Codeine; Hydrocodone; and Tylenol  Home Medications   Current Outpatient Rx  Name  Route  Sig  Dispense  Refill  . ALPRAZOLAM 1 MG PO TABS   Oral   Take 0.5-1 mg by mouth 2 (two) times daily as needed. For anxiety         . LAMOTRIGINE 100 MG PO TABS   Oral   Take 100 mg by mouth every morning.          . CENTRUM  SILVER ULTRA WOMENS PO TABS   Oral   Take 1 tablet by mouth daily.           Marland Kitchen PAROXETINE HCL 20 MG PO TABS   Oral   Take 20 mg by mouth every morning.           Marland Kitchen PRAVASTATIN SODIUM 40 MG PO TABS   Oral   Take 40 mg by mouth every evening.          . PSYLLIUM 58.6 % PO PACK   Oral   Take 1 packet by mouth daily.           . QUETIAPINE FUMARATE 100 MG PO TABS   Oral   Take 100-200 mg by mouth at bedtime.            BP 105/69  Pulse 61  Temp 98.3 F (36.8 C) (Oral)  Resp 20  SpO2 94%  LMP 11/16/2009  Physical Exam  Nursing note and vitals reviewed. Constitutional: She appears well-developed.  HENT:  Head: Normocephalic and atraumatic.  Right Ear: External ear normal.  Left Ear: External ear normal.  Eyes: Pupils are equal,  round, and reactive to light.  Neck: Normal range of motion.  Psychiatric: Her speech is rapid and/or pressured. She is agitated, aggressive and combative. Thought content is paranoid. She expresses impulsivity and inappropriate judgment.    ED Course  Procedures (including critical care time)  Labs Reviewed  CBC WITH DIFFERENTIAL - Abnormal; Notable for the following:    RBC 3.78 (*)     HCT 35.5 (*)     All other components within normal limits  COMPREHENSIVE METABOLIC PANEL - Abnormal; Notable for the following:    Potassium 3.0 (*)     GFR calc non Af Amer 64 (*)     GFR calc Af Amer 74 (*)     All other components within normal limits  ETHANOL  GLUCOSE, CAPILLARY  URINE RAPID DRUG SCREEN (HOSP PERFORMED)   No results found. Results for orders placed during the hospital encounter of 07/04/12  CBC WITH DIFFERENTIAL      Component Value Range   WBC 8.2  4.0 - 10.5 K/uL   RBC 3.78 (*) 3.87 - 5.11 MIL/uL   Hemoglobin 12.2  12.0 - 15.0 g/dL   HCT 40.9 (*) 81.1 - 91.4 %   MCV 93.9  78.0 - 100.0 fL   MCH 32.3  26.0 - 34.0 pg   MCHC 34.4  30.0 - 36.0 g/dL   RDW 78.2  95.6 - 21.3 %   Platelets 183  150 - 400 K/uL    Neutrophils Relative 71  43 - 77 %   Neutro Abs 5.8  1.7 - 7.7 K/uL   Lymphocytes Relative 22  12 - 46 %   Lymphs Abs 1.8  0.7 - 4.0 K/uL   Monocytes Relative 6  3 - 12 %   Monocytes Absolute 0.5  0.1 - 1.0 K/uL   Eosinophils Relative 1  0 - 5 %   Eosinophils Absolute 0.1  0.0 - 0.7 K/uL   Basophils Relative 0  0 - 1 %   Basophils Absolute 0.0  0.0 - 0.1 K/uL  COMPREHENSIVE METABOLIC PANEL      Component Value Range   Sodium 137  135 - 145 mEq/L   Potassium 3.0 (*) 3.5 - 5.1 mEq/L   Chloride 98  96 - 112 mEq/L   CO2 26  19 - 32 mEq/L   Glucose, Bld 95  70 - 99 mg/dL   BUN 10  6 - 23 mg/dL   Creatinine, Ser 0.86  0.50 - 1.10 mg/dL   Calcium 9.2  8.4 - 57.8 mg/dL   Total Protein 7.2  6.0 - 8.3 g/dL   Albumin 4.3  3.5 - 5.2 g/dL   AST 33  0 - 37 U/L   ALT 17  0 - 35 U/L   Alkaline Phosphatase 94  39 - 117 U/L   Total Bilirubin 0.3  0.3 - 1.2 mg/dL   GFR calc non Af Amer 64 (*) >90 mL/min   GFR calc Af Amer 74 (*) >90 mL/min  ETHANOL      Component Value Range   Alcohol, Ethyl (B) <11  0 - 11 mg/dL  GLUCOSE, CAPILLARY      Component Value Range   Glucose-Capillary 84  70 - 99 mg/dL     No diagnosis found.  Patient given Haldol 20 mg IM and reassessed on reassessment the patient is in 4. restraints. Heart rate is 84 blood pressure 113/70 HEENT normocephalic atraumatic eyes pupils equal round react to light extraocular movements are intact  neck trachea is midline carotid pulses are 2+ there is no thyromegaly noted. Chest walls reveals no obvious signs of trauma Lungs clear to auscultation bilaterally Heart is regular rate and rhythm Abdomen is soft and nontender Extremities pulses are intact in all 4 extremities full active range of motion Patient is awake and alert she is able to answer most of my questions appropriately now it is still unclear when she last took her medications. MDM  This is a 51 year old female with history of bipolar disorder who presents today in  mania. She is mildly hypokalemic and this is orally replenished. Psych ordered holding orders will be placed and patient will have assessment by psychiatry.       Hilario Quarry, MD 07/04/12 [phone]

## 2012-07-04 NOTE — ED Notes (Signed)
Pt arrived w/ sheriff dept yelling, screaming, cursing, spitting, pt cuffed on arrival. . CN/sheriff/security/[employer] at bedside w/ pt, dr Rosalia Hammers aware and will be into see.

## 2012-07-04 NOTE — ED Notes (Signed)
Pt. Is still unable to use the restroom at this time, but she is aware that we need urine.

## 2012-07-04 NOTE — ED Notes (Signed)
Sleeping, easily aroused, smiling, talking calmly, numerous bruises  noted on all extremities, small avulsions (<1 cm) x2 noted on inner lt hand.   Pt is aware that she will be released for the restraints if she is able to cooperate, pt verbalized understanding.  Sips of water offered, pt reports a sore throat and that her grandaughter  Has also been sick.

## 2012-07-04 NOTE — ED Notes (Signed)
Mae x4, pos pulses all extremities, pt mumblin, not yelling as much, lab at bedside drawing blood. Pt tolerating well

## 2012-07-04 NOTE — ED Notes (Signed)
Report to [employer], 1 bag of belongings given to RN-note pts clothing had been cut off

## 2012-07-04 NOTE — ED Notes (Signed)
Pt's [facility]: [phone]                           Cell:  [phone]

## 2012-07-04 NOTE — ED Notes (Signed)
ZOX:WRUE<AV> Expected date:<BR> Expected time:<BR> Means of arrival:<BR> Comments:<BR> Psych ed sedated

## 2012-07-04 NOTE — ED Notes (Addendum)
Dr ray into see [facility] for Haldol 20 mg IM.

## 2012-07-04 NOTE — ED Notes (Addendum)
Calmer, remains in restraints intermitantly  Kicking/fighting/yelling

## 2012-07-04 NOTE — Progress Notes (Signed)
Per Dr. Rosalia Hammers,  [facility] ACT initiated petition and pt will be under petition while in ED due to combative and harmful behaviors.

## 2012-07-04 NOTE — ED Notes (Signed)
Mae x4, pos pulses all extremities, restaints in place.  Calmer, talking quieter, but still rambling

## 2012-07-04 NOTE — ED Notes (Signed)
MAE x4, pos pulses all extrmities,, no change

## 2012-07-04 NOTE — ED Notes (Signed)
Pt calmer, more focused, following directionm. Rt ankle restaint removed. Pos pulses all extremities., MAE x4

## 2012-07-04 NOTE — ED Notes (Signed)
Restrained, talking/screaming "suicide", cursing, wanting to die. Security /sheriff at bedside

## 2012-07-04 NOTE — ED Notes (Signed)
restaints in place, MAE x4, pos pulses all extremities.

## 2012-07-04 NOTE — ED Notes (Signed)
Restraints removed.  Pt calm and cooperative.  States she did not take meds last night.  States she had compulsions to say things at home.

## 2012-07-04 NOTE — ED Notes (Signed)
Pt. Is not able to use the restroom at this time, but she is aware that we need a urine specimen.  

## 2012-07-04 NOTE — ED Notes (Addendum)
[employer] arrived with pt in hand cuffs, pt loud wild and spitting. Pt swearing and wanting to kill people, pt calling staff whores, using God's name positive and negative.Pt followed at [facility], they have been changing her meds. Pt is not taking meds

## 2012-07-04 NOTE — ED Notes (Signed)
Pt smiling, talking quietly about family/pets/children, calm, cooperative.  Lt wrist removed.  MAE x4, pos pulses all extremities.

## 2012-07-04 NOTE — ED Notes (Signed)
Restained, rambling speech, wanting to die trelling staff to "fall on your knees.' mae X4, POS PULSES ALL EXTREMITIES.

## 2012-07-05 LAB — RAPID URINE DRUG SCREEN, HOSP PERFORMED
Amphetamines: NOT DETECTED
Barbiturates: NOT DETECTED
Benzodiazepines: NOT DETECTED
Cocaine: NOT DETECTED
Opiates: NOT DETECTED
Tetrahydrocannabinol: POSITIVE — AB

## 2012-07-05 MED ORDER — POTASSIUM CHLORIDE CRYS ER 20 MEQ PO TBCR
40.0000 meq | EXTENDED_RELEASE_TABLET | Freq: Once | ORAL | Status: AC
  Administered 2012-07-05: 40 meq via ORAL
  Filled 2012-07-05: qty 2

## 2012-07-05 NOTE — ED Provider Notes (Addendum)
Pt resting, nad. Vitals normal.  Discussed w act team - awaiting placement.   Suzi Roots, MD 07/05/12 [phone]  telepsych indicates psych clear for d/c home, recommends d/c home w rec to inc seroquel to 300 q pm.  Pt alert, content. Normal mood/affect. No delusions or hallucinations. Denies any thought of harm to self or others.      Suzi Roots, MD 07/05/12 1311

## 2012-07-05 NOTE — ED Notes (Signed)
Visitors at bedside.

## 2012-07-05 NOTE — BH Assessment (Signed)
Assessment Note   Natalie [name] is an 51 y.o. female that presented with [employer] with manic behaviors, including attempting to eat her hair and spitting and making threats of "suicide" and threats towards others.  Pt asked [employer] to shoot her in the heart.  Pt had to be given Haldol IM to calm down and has been intermittently sleeping ever since.  Pt did wake up for vitals and was able to respond to most assessment questions.  Pt denies current SI and reports "I was just sleepy.  I am sleepy."  Pt is a poor historian and unable to give information regarding her psychiatric history, but [employer] report that she does have a history of being seen and treated before with a previous placement at "[facility]."  They also report that pt stated that she is God and speaks to God on a regular basis.  Pt does have a husband listed as her emergency contact so she has some support, though it appears as though not regular psychiatric follow up.  Pt denies substance abuse but has not yet given a urine sample.  Writer recommends a Telepsych evaluation to determine course of treatment and for continuity of care.  Dr. Lorenso Courier and nursing staff notified and aware of recommendations.  Nursing staff to initiate Telepsych consultation.  Results from that will determine course of treatment.      Axis I: Bipolar, mixed Axis II: Deferred Axis III:  Past Medical History  Diagnosis Date  . Bipolar 1 disorder   . Smoker   . Allergy     seasonal  . Anxiety     bipolar  . Depression   . Glaucoma     stage 2  . Hyperlipidemia    Axis IV: other psychosocial or environmental problems and problems related to social environment Axis V: 21-30 behavior considerably influenced by delusions or hallucinations OR serious impairment in judgment, communication OR inability to function in almost all areas  Past Medical History:  Past Medical History  Diagnosis Date  . Bipolar 1 disorder   . Smoker   . Allergy     seasonal  . Anxiety     bipolar    . Depression   . Glaucoma     stage 2  . Hyperlipidemia     Past Surgical History  Procedure Date  . Tubal ligation 1993  . Cosmetic surgery 1967    face after accident    Family History:  Family History  Problem Relation Age of Onset  . Colon cancer Maternal Grandfather     Social History:  reports that she has been smoking Cigarettes.  She has been smoking about 1 pack per day. She has never used smokeless tobacco. She reports that she does not drink alcohol or use illicit drugs.  Additional Social History:     CIWA: CIWA-Ar BP: 101/66 mmHg Pulse Rate: 66  COWS:    Allergies:  Allergies  Allergen Reactions  . Sulfa Antibiotics Anaphylaxis and Swelling     flu like sx  . Aspirin Other (See Comments)    Has ulcers  . Codeine Nausea And Vomiting and Other (See Comments)    Stomach pain  . Hydrocodone Nausea And Vomiting  . Tylenol (Acetaminophen) Other (See Comments)    Has ulcers    Home Medications:  (Not in a hospital admission)  OB/GYN Status:  Patient's last menstrual period was 11/16/2009.  General Assessment Data Location of Assessment: [facility] ED Living Arrangements: Spouse/significant other Can pt return to  current living arrangement?: Yes Admission Status: Involuntary Is patient capable of signing voluntary admission?: No Transfer from: Acute Hospital Referral Source: Self/Family/Friend  Education Status Is patient currently in school?: No  Risk to self Suicidal Ideation: No Suicidal Intent: No Is patient at risk for suicide?: Yes Suicidal Plan?: No Access to Means: No What has been your use of drugs/alcohol within the last 12 months?: pt denies and drug screen has not been obtained yet Previous Attempts/Gestures: Yes How many times?:  (unknown; pt won't say) Other Self Harm Risks: unpredictable and reckless Triggers for Past Attempts: Unpredictable Intentional Self Injurious Behavior: Damaging Comment - Self Injurious Behavior: Pt was  eating her hair and spitting upon admission Family Suicide History: No Recent stressful life event(s): Conflict (Comment);Turmoil (Comment) Persecutory voices/beliefs?: No Depression: Yes Depression Symptoms: Feeling worthless/self pity;Feeling angry/irritable;Insomnia Substance abuse history and/or treatment for substance abuse?: No Suicide prevention information given to non-admitted patients: Not applicable  Risk to Others Homicidal Ideation: No Thoughts of Harm to Others:  (threatening upon admission) Current Homicidal Intent: No Current Homicidal Plan: No Access to Homicidal Means: No Identified Victim: none currently History of harm to others?: Yes Assessment of Violence: On admission Violent Behavior Description: was spitting and threatening upon admission Does patient have access to weapons?: No Criminal Charges Pending?:  (pt denies) Does patient have a court date: No  Psychosis Hallucinations: None noted Delusions: None noted  Mental Status Report Appear/Hygiene: Disheveled Eye Contact: Poor Motor Activity: Psychomotor retardation Speech: Soft;Slurred;Slow Level of Consciousness: Drowsy;Sedated Mood: Ambivalent Affect: Appropriate to circumstance Anxiety Level: Minimal Thought Processes: Relevant Judgement: Impaired Orientation: Person;Place;Time;Situation Obsessive Compulsive Thoughts/Behaviors: Minimal  Cognitive Functioning Concentration: Decreased Memory: Recent Impaired;Remote Impaired IQ: Average Insight: Poor Impulse Control: Poor Appetite:  (unknown; pt won't say) Weight Loss: 0  Weight Gain: 0  Sleep:  (pt has been sleeping since her Haldol shot last night) Total Hours of Sleep:  (unknown; pt reports that she remains sleepy) Vegetative Symptoms: None  ADLScreening [facility] Assessment Services) Patient's cognitive ability adequate to safely complete daily activities?: Yes Patient able to express need for assistance with ADLs?: Yes Independently  performs ADLs?: Yes (appropriate for developmental age)  Abuse/Neglect [facility]) Physical Abuse: Denies Verbal Abuse: Denies Sexual Abuse: Denies  Prior Inpatient Therapy Prior Inpatient Therapy: Yes Prior Therapy Dates: pt cannot remember Prior Therapy Facilty/Provider(s): Pt won't say Reason for Treatment: BiPolar symptoms  Prior Outpatient Therapy Prior Outpatient Therapy: Yes Prior Therapy Dates: currently Prior Therapy Facilty/Provider(s): [facility]? Reason for Treatment: medication management  ADL Screening (condition at time of admission) Patient's cognitive ability adequate to safely complete daily activities?: Yes Patient able to express need for assistance with ADLs?: Yes Independently performs ADLs?: Yes (appropriate for developmental age)       Abuse/Neglect Assessment (Assessment to be complete while patient is alone) Physical Abuse: Denies Verbal Abuse: Denies Sexual Abuse: Denies Values / Beliefs Cultural Requests During Hospitalization: None Spiritual Requests During Hospitalization: None        Additional Information 1:1 In Past 12 Months?: Yes CIRT Risk: Yes Elopement Risk: Yes Does patient have medical clearance?: Yes     Disposition: Awaiting Telepsych disoposition. Disposition Disposition of Patient: Inpatient treatment program Type of inpatient treatment program: Adult  On Site Evaluation by:   Reviewed with Physician:     Angelica Ran 07/05/2012 5:24 AM

## 2013-03-11 ENCOUNTER — Ambulatory Visit (INDEPENDENT_AMBULATORY_CARE_PROVIDER_SITE_OTHER): Payer: Medicaid Other | Admitting: Family Medicine

## 2013-03-11 ENCOUNTER — Encounter: Payer: Self-pay | Admitting: Family Medicine

## 2013-03-11 VITALS — BP 110/70 | HR 78 | Temp 98.1°F | Resp 16 | Wt 151.0 lb

## 2013-03-11 DIAGNOSIS — J029 Acute pharyngitis, unspecified: Secondary | ICD-10-CM

## 2013-03-11 MED ORDER — AMOXICILLIN 875 MG PO TABS: 875.0000 mg | ORAL_TABLET | Freq: Two times a day (BID) | ORAL | Status: DC

## 2013-03-11 NOTE — Progress Notes (Signed)
Subjective:    Patient ID: Natalie [name], female    DOB: 1960-10-02, 52 y.o.   MRN: [id number]  HPI Patient presents with a three-day history of sore throat. It has been gradually worsening. She now has a swollen tender lymph node in the right anterior cervical chain. She reports subjective fevers. She denies any rhinorrhea cough or otalgia. She denies any sick contacts. Past Medical History  Diagnosis Date  . Bipolar 1 disorder   . Smoker   . Allergy     seasonal  . Anxiety     bipolar  . Depression   . Glaucoma     stage 2  . Hyperlipidemia    Current Outpatient Prescriptions on File Prior to Visit  Medication Sig Dispense Refill  . diphenhydrAMINE (BENADRYL) 25 mg capsule Take 25 mg by mouth every 6 (six) hours as needed. For allergies      . lamoTRIgine (LAMICTAL) 100 MG tablet Take 100 mg by mouth every morning.       . Multiple Vitamins-Minerals (CENTRUM SILVER ULTRA WOMENS) TABS Take 1 tablet by mouth daily.        Marland Kitchen PARoxetine (PAXIL) 20 MG tablet Take 20 mg by mouth every morning.        . pravastatin (PRAVACHOL) 40 MG tablet Take 40 mg by mouth every evening.       . psyllium (METAMUCIL) 58.6 % packet Take 1 packet by mouth daily.        . QUEtiapine (SEROQUEL) 100 MG tablet Take 100-200 mg by mouth at bedtime.        No current facility-administered medications on file prior to visit.   Allergies  Allergen Reactions  . Sulfa Antibiotics Anaphylaxis and Swelling     flu like sx  . Aspirin Other (See Comments)    Has ulcers  . Codeine Nausea And Vomiting and Other (See Comments)    Stomach pain  . Hydrocodone Nausea And Vomiting  . Tylenol (Acetaminophen) Other (See Comments)    Has ulcers   History   Social History  . Marital Status: Married    Spouse Name: N/A    Number of Children: N/A  . Years of Education: N/A   Occupational History  . Not on file.   Social History Main Topics  . Smoking status: Current Every Day Smoker -- 1.00 packs/day    Types:  Cigarettes  . Smokeless tobacco: Never Used  . Alcohol Use: No  . Drug Use: No  . Sexually Active: Not on file   Other Topics Concern  . Not on file   Social History Narrative  . No narrative on file      Review of Systems  All other systems reviewed and are negative.       Objective:   Physical Exam  Vitals reviewed. HENT:  Right Ear: External ear normal.  Left Ear: External ear normal.  Nose: Nose normal.  Mouth/Throat: Oropharyngeal exudate, posterior oropharyngeal edema and posterior oropharyngeal erythema present.  Neck: Normal range of motion.  Cardiovascular: Normal rate and regular rhythm.   Pulmonary/Chest: Effort normal and breath sounds normal.  Lymphadenopathy:    She has cervical adenopathy.          Assessment & Plan:  1. Acute pharyngitis Patient meets clinical criteria for strep throat irregardless of the rapid strep screen. Therefore I will begin empiric treatment with amoxicillin 875 mg by mouth twice a day x10 days. Recommended ibuprofen 800 mg every 8 hours. Follow next week if  no better or sooner if worse. - amoxicillin (AMOXIL) 875 MG tablet; Take 1 tablet (875 mg total) by mouth 2 (two) times daily.  Dispense: 20 tablet; Refill: 0

## 2013-10-07 ENCOUNTER — Ambulatory Visit: Payer: Medicaid Other | Admitting: Family Medicine

## 2013-10-20 ENCOUNTER — Encounter: Payer: Self-pay | Admitting: Physician Assistant

## 2013-10-20 ENCOUNTER — Ambulatory Visit (INDEPENDENT_AMBULATORY_CARE_PROVIDER_SITE_OTHER): Payer: Medicaid Other | Admitting: Physician Assistant

## 2013-10-20 VITALS — BP 116/70 | HR 64 | Temp 98.2°F | Resp 18 | Ht 62.5 in | Wt 168.0 lb

## 2013-10-20 DIAGNOSIS — M7730 Calcaneal spur, unspecified foot: Secondary | ICD-10-CM

## 2013-10-20 DIAGNOSIS — L600 Ingrowing nail: Secondary | ICD-10-CM

## 2013-10-20 DIAGNOSIS — Z87891 Personal history of nicotine dependence: Secondary | ICD-10-CM | POA: Insufficient documentation

## 2013-10-20 DIAGNOSIS — E785 Hyperlipidemia, unspecified: Secondary | ICD-10-CM

## 2013-10-20 DIAGNOSIS — F17200 Nicotine dependence, unspecified, uncomplicated: Secondary | ICD-10-CM

## 2013-10-20 DIAGNOSIS — H409 Unspecified glaucoma: Secondary | ICD-10-CM | POA: Insufficient documentation

## 2013-10-20 DIAGNOSIS — F319 Bipolar disorder, unspecified: Secondary | ICD-10-CM

## 2013-10-20 MED ORDER — MELOXICAM 7.5 MG PO TABS: 7.5000 mg | ORAL_TABLET | Freq: Every day | ORAL | Status: DC

## 2013-10-20 NOTE — Progress Notes (Signed)
Patient ID: Natalie [name] MRN: [id number], DOB: 02-Feb-1961, 53 y.o. Date of Encounter: 10/20/2013, 10:42 AM    Chief Complaint:  Chief Complaint  Patient presents with  . right foot pain    hx heel spurs  . ingrown nail left foot  . Medication Refill    cholesterol meds has been out of for some time,  Is NOT fasting today     HPI: 53 y.o. year old white [place], she was diagnosed with heel spurs on the right heel. Says that they did x-ray. Says they did an injection and discussed surgery. Says that it has not given her much problem until the past 2-3 weeks. says she has done no particular activity or exercise to have triggered this increased pain. HAs seen no podiatry since living in this area.  Has complaints of ingrown toenail of the left first toe. Toe nail is cut way down short period less than one half of the nail is present. The distal half is missing. She says that she has done this herself. Says that she has done this because it hurts so bad whenever it starts to grow out.  Says that she's been out of cholesterol medication for 2 months. I asked her while she was off of that and she says that she just didn't realize it had been so long since she had been in for visit etc.  SHe does smoke. Smokes about one half pack per day.  She has no family history of CAD. I specifically asked about her mother father brother and sister and she states that each of these has no known  CAD.      Home Meds: See attached medication section for any medications that were entered at today's visit. The computer does not put those onto this list.The following list is a list of meds entered prior to today's visit.   Current Outpatient Prescriptions on File Prior to Visit  Medication Sig Dispense Refill  . diphenhydrAMINE (BENADRYL) 25 mg capsule Take 25 mg by mouth every 6 (six) hours as needed. For allergies      . estradiol (ESTRACE)  0.5 MG tablet Take 0.5 mg by mouth daily.      Marland Kitchen lamoTRIgine (LAMICTAL) 100 MG tablet Take 100 mg by mouth every morning.       . Multiple Vitamins-Minerals (CENTRUM SILVER ULTRA WOMENS) TABS Take 1 tablet by mouth daily.        Marland Kitchen PARoxetine (PAXIL) 20 MG tablet Take 20 mg by mouth every morning.        . psyllium (METAMUCIL) 58.6 % packet Take 1 packet by mouth daily.        . QUEtiapine (SEROQUEL) 100 MG tablet Take 100-200 mg by mouth at bedtime.       . pravastatin (PRAVACHOL) 40 MG tablet Take 40 mg by mouth every evening.        No current facility-administered medications on file prior to visit.    Allergies:  Allergies  Allergen Reactions  . Sulfa Antibiotics Anaphylaxis and Swelling     flu like sx  . Aspirin Other (See Comments)    Has ulcers  . Codeine Nausea And Vomiting and Other (See Comments)    Stomach pain  . Hydrocodone Nausea And Vomiting  . Tylenol [Acetaminophen] Other (See Comments)    Has ulcers      Review of Systems: See HPI for pertinent ROS.  All other ROS negative.    Physical Exam: Blood pressure 116/70, pulse 64, temperature 98.2 F (36.8 C), temperature source Oral, resp. rate 18, height 5' 2.5" (1.588 m), weight 168 lb (76.204 kg), last menstrual period 11/16/2009., Body mass index is 30.22 kg/(m^2). General:  Appears in no acute distress. Neck: Supple. No thyromegaly. No lymphadenopathy. Lungs: Clear bilaterally to auscultation without wheezes, rales, or rhonchi. Breathing is unlabored. Heart: Regular rhythm. No murmurs, rubs, or gallops. Msk:  Strength and tone normal for age. Right foot: Significant pain with palpation of the heel. Left Foot: First toe: Distal one half of toenail is missing. Ingrown toenails on each side. Extremities/Skin: Warm and dry.  Neuro: Alert and oriented X 3. Moves all extremities spontaneously. Gait is normal. CNII-XII grossly in tact. Psych:  Responds to questions appropriately with a normal affect.      ASSESSMENT AND PLAN:  53 y.o. year old female with  1. Heel spur - Ambulatory referral to Podiatry - meloxicam (MOBIC) 7.5 MG tablet; Take 1 tablet (7.5 mg total) by mouth daily.  Dispense: 30 tablet; Refill: 0  2. Ingrown toenail - Ambulatory referral to Podiatry - meloxicam (MOBIC) 7.5 MG tablet; Take 1 tablet (7.5 mg total) by mouth daily.  Dispense: 30 tablet; Refill: 0  3. Hyperlipidemia  4. Smoker  5. Bipolar disorder, unspecified  6. Glaucoma  She is not fasting today so we cannot check FLP now. Discussed having her come in for complete physical exam in the near future and she is agreeable to do so. I gave her the option of coming week prior to this physical to do fasting labs versus doing the labs at the appointment. She wants to do the labs while at the appointment. Schedule complete physical exam for early morning and she is instructed to have nothing to eat or drink that morning.  [address] [place], [place], [facility] 10/20/2013 10:42 AM

## 2013-11-04 ENCOUNTER — Encounter: Payer: Self-pay | Admitting: Family Medicine

## 2013-11-04 DIAGNOSIS — F419 Anxiety disorder, unspecified: Secondary | ICD-10-CM | POA: Insufficient documentation

## 2013-11-04 DIAGNOSIS — F32A Depression, unspecified: Secondary | ICD-10-CM | POA: Insufficient documentation

## 2013-11-04 DIAGNOSIS — F314 Bipolar disorder, current episode depressed, severe, without psychotic features: Secondary | ICD-10-CM | POA: Insufficient documentation

## 2013-11-04 DIAGNOSIS — F329 Major depressive disorder, single episode, unspecified: Secondary | ICD-10-CM | POA: Insufficient documentation

## 2013-11-04 DIAGNOSIS — T7840XA Allergy, unspecified, initial encounter: Secondary | ICD-10-CM | POA: Insufficient documentation

## 2013-11-09 ENCOUNTER — Encounter: Payer: Self-pay | Admitting: Physician Assistant

## 2013-11-09 ENCOUNTER — Ambulatory Visit (INDEPENDENT_AMBULATORY_CARE_PROVIDER_SITE_OTHER): Payer: Medicaid Other | Admitting: Physician Assistant

## 2013-11-09 VITALS — BP 134/80 | HR 76 | Temp 97.9°F | Resp 18 | Ht 62.5 in | Wt 164.0 lb

## 2013-11-09 DIAGNOSIS — F319 Bipolar disorder, unspecified: Secondary | ICD-10-CM

## 2013-11-09 DIAGNOSIS — F32A Depression, unspecified: Secondary | ICD-10-CM

## 2013-11-09 DIAGNOSIS — Z23 Encounter for immunization: Secondary | ICD-10-CM

## 2013-11-09 DIAGNOSIS — F329 Major depressive disorder, single episode, unspecified: Secondary | ICD-10-CM

## 2013-11-09 DIAGNOSIS — F411 Generalized anxiety disorder: Secondary | ICD-10-CM

## 2013-11-09 DIAGNOSIS — F17200 Nicotine dependence, unspecified, uncomplicated: Secondary | ICD-10-CM

## 2013-11-09 DIAGNOSIS — H409 Unspecified glaucoma: Secondary | ICD-10-CM

## 2013-11-09 DIAGNOSIS — F3289 Other specified depressive episodes: Secondary | ICD-10-CM

## 2013-11-09 DIAGNOSIS — F419 Anxiety disorder, unspecified: Secondary | ICD-10-CM

## 2013-11-09 DIAGNOSIS — E785 Hyperlipidemia, unspecified: Secondary | ICD-10-CM

## 2013-11-09 DIAGNOSIS — Z0000 Encounter for general adult medical examination without abnormal findings: Secondary | ICD-10-CM

## 2013-11-09 LAB — CBC WITH DIFFERENTIAL/PLATELET
Basophils Absolute: 0 10*3/uL (ref 0.0–0.1)
Basophils Relative: 0 % (ref 0–1)
Eosinophils Absolute: 0.3 10*3/uL (ref 0.0–0.7)
Eosinophils Relative: 4 % (ref 0–5)
HCT: 38.6 % (ref 36.0–46.0)
Hemoglobin: 13.3 g/dL (ref 12.0–15.0)
Lymphocytes Relative: 33 % (ref 12–46)
Lymphs Abs: 2.5 10*3/uL (ref 0.7–4.0)
MCH: 32.3 pg (ref 26.0–34.0)
MCHC: 34.5 g/dL (ref 30.0–36.0)
MCV: 93.7 fL (ref 78.0–100.0)
Monocytes Absolute: 0.5 10*3/uL (ref 0.1–1.0)
Monocytes Relative: 7 % (ref 3–12)
Neutro Abs: 4.3 10*3/uL (ref 1.7–7.7)
Neutrophils Relative %: 56 % (ref 43–77)
Platelets: 221 10*3/uL (ref 150–400)
RBC: 4.12 MIL/uL (ref 3.87–5.11)
RDW: 14.3 % (ref 11.5–15.5)
WBC: 7.7 10*3/uL (ref 4.0–10.5)

## 2013-11-09 LAB — COMPLETE METABOLIC PANEL WITH GFR
ALT: 12 U/L (ref 0–35)
AST: 16 U/L (ref 0–37)
Albumin: 4.3 g/dL (ref 3.5–5.2)
Alkaline Phosphatase: 72 U/L (ref 39–117)
BUN: 8 mg/dL (ref 6–23)
CO2: 24 mEq/L (ref 19–32)
Calcium: 8.9 mg/dL (ref 8.4–10.5)
Chloride: 103 mEq/L (ref 96–112)
Creat: 0.85 mg/dL (ref 0.50–1.10)
GFR, Est African American: 89 mL/min
GFR, Est Non African American: 77 mL/min
Glucose, Bld: 87 mg/dL (ref 70–99)
Potassium: 4 mEq/L (ref 3.5–5.3)
Sodium: 139 mEq/L (ref 135–145)
Total Bilirubin: 0.4 mg/dL (ref 0.2–1.2)
Total Protein: 6.9 g/dL (ref 6.0–8.3)

## 2013-11-09 LAB — LIPID PANEL
Cholesterol: 205 mg/dL — ABNORMAL HIGH (ref 0–200)
HDL: 48 mg/dL (ref >39)
LDL Cholesterol: 134 mg/dL — ABNORMAL HIGH (ref 0–99)
Total CHOL/HDL Ratio: 4.3 Ratio
Triglycerides: 114 mg/dL (ref <150)
VLDL: 23 mg/dL (ref 0–40)

## 2013-11-09 LAB — TSH: TSH: 1.19 u[IU]/mL (ref 0.350–4.500)

## 2013-11-09 NOTE — Addendum Note (Signed)
Addended by: Olena Mater on: 11/09/2013 08:54 AM   Modules accepted: Orders

## 2013-11-09 NOTE — Progress Notes (Signed)
Patient ID: Natalie [name] MRN: [id number], DOB: 1960/08/29, 53 y.o. Date of Encounter: @DATE @  Chief Complaint:  Chief Complaint  Patient presents with  . Annual Exam    is fasting    HPI: 53 y.o. year old female  presents complete physical exam.  She sees Dr. Gershon Mussel. Gynecologist. Seen him within the past year.  Also  sees mental health on a routine basis.  She is taking her pravastatin for her hyperlipidemia.  She is a smoker but is trying to quit. Is using the E-Cig. As she's been using this for 2 weeks and has decreased to only 10 cigarettes in the past 3 days. Was smoking one pack per day prior to 2 weeks ago.  She has no premature family history of CAD or any cnacers.    Past Medical History  Diagnosis Date  . Bipolar 1 disorder   . Smoker   . Glaucoma     stage 2  . Hyperlipidemia   . Allergy     seasonal  . Anxiety     bipolar  . Depression      Home Meds: See attached medication section for current medication list. Any medications entered into computer today will not appear on this note's list. The medications listed below were entered prior to today. Current Outpatient Prescriptions on File Prior to Visit  Medication Sig Dispense Refill  . diphenhydrAMINE (BENADRYL) 25 mg capsule Take 25 mg by mouth every 6 (six) hours as needed. For allergies      . estradiol (ESTRACE) 0.5 MG tablet Take 0.5 mg by mouth daily.      Marland Kitchen lamoTRIgine (LAMICTAL) 100 MG tablet Take 100 mg by mouth every morning.       . Multiple Vitamins-Minerals (CENTRUM SILVER ULTRA WOMENS) TABS Take 1 tablet by mouth daily.        Marland Kitchen PARoxetine (PAXIL) 20 MG tablet Take 20 mg by mouth every morning.        . pravastatin (PRAVACHOL) 40 MG tablet Take 40 mg by mouth every evening.       . psyllium (METAMUCIL) 58.6 % packet Take 1 packet by mouth as needed.        No current facility-administered medications on file prior to visit.    Allergies:  Allergies  Allergen Reactions  . Sulfa  Antibiotics Anaphylaxis and Swelling     flu like sx  . Aspirin Other (See Comments)    Has ulcers  . Codeine Nausea And Vomiting and Other (See Comments)    Stomach pain  . Hydrocodone Nausea And Vomiting  . Mobic [Meloxicam] Nausea And Vomiting  . Tylenol [Acetaminophen] Other (See Comments)    Has ulcers    History   Social History  . Marital Status: Married    Spouse Name: N/A    Number of Children: N/A  . Years of Education: N/A   Occupational History  . Not on file.   Social History Main Topics  . Smoking status: Current Every Day Smoker -- 1.00 packs/day    Types: Cigarettes  . Smokeless tobacco: Never Used  . Alcohol Use: No  . Drug Use: No  . Sexual Activity: Not on file   Other Topics Concern  . Not on file   Social History Narrative  . No narrative on file    Family History  Problem Relation Age of Onset  . Colon cancer Maternal Grandfather   . Hypertension Father   . Hyperlipidemia Father   .  Cancer Neg Hx   . Heart disease Neg Hx      Review of Systems:  See HPI for pertinent ROS. All other ROS negative.    Physical Exam: Blood pressure 134/80, pulse 76, temperature 97.9 F (36.6 C), temperature source Oral, resp. rate 18, height 5' 2.5" (1.588 m), weight 164 lb (74.39 kg), last menstrual period 11/16/2009., Body mass index is 29.5 kg/(m^2). General: WF. Appears in no acute distress. Head: Normocephalic, atraumatic, eyes without discharge, sclera non-icteric, nares are without discharge. Bilateral auditory canals clear, TM's are without perforation, pearly grey and translucent with reflective cone of light bilaterally. Oral cavity moist, posterior pharynx without exudate, erythema, peritonsillar abscess, or post nasal drip.  Neck: Supple. No thyromegaly. No lymphadenopathy. Lungs: Clear bilaterally to auscultation without wheezes, rales, or rhonchi. Breathing is unlabored. Heart: RRR with S1 S2. No murmurs, rubs, or gallops. Abdomen: Soft,  non-tender, non-distended with normoactive bowel sounds. No hepatomegaly. No rebound/guarding. No obvious abdominal masses. Musculoskeletal:  Strength and tone normal for age. Breast Exam: Deferred today as this has been done by GYN in the past 12 months. Pelvic Exam: Deferred today as it has been done by GYN in the past 12 months. Extremities/Skin: Warm and dry. No clubbing or cyanosis. No edema. No rashes or suspicious lesions. Neuro: Alert and oriented X 3. Moves all extremities spontaneously. Gait is normal. CNII-XII grossly in tact. Psych:  Responds to questions appropriately with a normal affect.     ASSESSMENT AND PLAN:  53 y.o. year old female with  1. Visit for preventive health examination  A. Screening Labs:  - CBC with Differential - COMPLETE METABOLIC PANEL WITH GFR - Lipid panel - TSH - Vit D  25 hydroxy (rtn osteoporosis monitoring)  B. breast exam, mammogram, pelvic exams, Pap smears--this is all managed by GYN and she has had this all done within the past 12 months.  C. Mammogram: He says she has had this in the past 12 months and it was negative. Managed by GYN.  D. Screening colonoscopy: Was done at [facility] --Dr. Deatra Ina-- 04/29/2011. Patient says that it showed some polyps and is to repeat 5 years. Therefore due to 04/28/16.  E. Immunizations:  Tetanus: She received this in 2008. Pneumonia vaccine: Pt states that she's never had any type of pneumonia vaccine. Given her smoking she needs to get a Pneumovax 23 today. She is agreeable to receive this today.  2. Hyperlipidemia On pravastatin. Only cardiac risk factor is smoking. Family history is negative. - COMPLETE METABOLIC PANEL WITH GFR - Lipid panel  3. Smoker SHe has decreased her smoking a lot with using E-cig.  Will Not discuss Chantix or  Wellbutrin given her psychiatric history and psychiatric medications.  4. Bipolar disorder, unspecified Mental health  5. Anxiety Per mental health  6.  Depression Per mental health  7. Glaucoma Per ophthalmology  Routine office visit 6 months or sooner if needed.   Marin Olp [place], [place], [facility] 11/09/2013 8:28 AM

## 2013-11-09 NOTE — Progress Notes (Signed)
Patient was given a Prevnar 13 and not the Pneumovax23 ordered by provider.  Provider was notified as soon as error was realized.  There will be no harm to patient.  Patient has been notified.  [employer] report completed.

## 2013-11-10 LAB — VITAMIN D 25 HYDROXY (VIT D DEFICIENCY, FRACTURES): Vit D, 25-Hydroxy: 25 ng/mL — ABNORMAL LOW (ref 30–89)

## 2013-11-15 ENCOUNTER — Encounter: Payer: Self-pay | Admitting: Family Medicine

## 2013-11-18 ENCOUNTER — Ambulatory Visit (INDEPENDENT_AMBULATORY_CARE_PROVIDER_SITE_OTHER): Payer: Medicaid Other | Admitting: Family Medicine

## 2013-11-18 ENCOUNTER — Encounter: Payer: Self-pay | Admitting: Family Medicine

## 2013-11-18 VITALS — BP 120/74 | HR 78 | Temp 97.7°F | Resp 18 | Ht 62.5 in | Wt 169.0 lb

## 2013-11-18 DIAGNOSIS — L600 Ingrowing nail: Secondary | ICD-10-CM

## 2013-11-18 MED ORDER — TRAMADOL HCL 50 MG PO TABS: ORAL_TABLET | ORAL | Status: DC

## 2013-11-18 NOTE — Progress Notes (Signed)
Subjective:    Patient ID: Natalie [name], female    DOB: 1960/08/11, 53 y.o.   MRN: [id number]  HPI Patient has a painful ingrown toenail on her left great toe for years. She constantly picks at it. There is small sliver of hard nail at the proximal corner of the toenail both medially and laterally. This is the area that is extremely painful for the patient.  Otherwise the patient has no toenail on the toe. Past Medical History  Diagnosis Date  . Bipolar 1 disorder   . Smoker   . Glaucoma     stage 2  . Hyperlipidemia   . Allergy     seasonal  . Anxiety     bipolar  . Depression    Past Surgical History  Procedure Laterality Date  . Tubal ligation  1993  . Cosmetic surgery  1967    face after accident   Current Outpatient Prescriptions on File Prior to Visit  Medication Sig Dispense Refill  . diphenhydrAMINE (BENADRYL) 25 mg capsule Take 25 mg by mouth every 6 (six) hours as needed. For allergies      . estradiol (ESTRACE) 0.5 MG tablet Take 0.5 mg by mouth daily.      Marland Kitchen lamoTRIgine (LAMICTAL) 100 MG tablet Take 100 mg by mouth every morning.       . Lurasidone HCl (LATUDA) 20 MG TABS Take 1 tablet by mouth at bedtime.      . Multiple Vitamins-Minerals (CENTRUM SILVER ULTRA WOMENS) TABS Take 1 tablet by mouth daily.        Marland Kitchen PARoxetine (PAXIL) 20 MG tablet Take 20 mg by mouth every morning.        . pravastatin (PRAVACHOL) 40 MG tablet Take 40 mg by mouth every evening.       . psyllium (METAMUCIL) 58.6 % packet Take 1 packet by mouth as needed.        No current facility-administered medications on file prior to visit.   Allergies  Allergen Reactions  . Sulfa Antibiotics Anaphylaxis and Swelling     flu like sx  . Aspirin Other (See Comments)    Has ulcers  . Codeine Nausea And Vomiting and Other (See Comments)    Stomach pain  . Hydrocodone Nausea And Vomiting  . Mobic [Meloxicam] Nausea And Vomiting  . Tylenol [Acetaminophen] Other (See Comments)    Has ulcers    History   Social History  . Marital Status: Married    Spouse Name: N/A    Number of Children: N/A  . Years of Education: N/A   Occupational History  . Not on file.   Social History Main Topics  . Smoking status: Current Every Day Smoker -- 1.00 packs/day    Types: Cigarettes  . Smokeless tobacco: Never Used  . Alcohol Use: No  . Drug Use: No  . Sexual Activity: Not on file   Other Topics Concern  . Not on file   Social History Narrative  . No narrative on file     Review of Systems  All other systems reviewed and are negative.      Objective:   Physical Exam  Vitals reviewed. Cardiovascular: Normal rate, regular rhythm and normal heart sounds.   Pulmonary/Chest: Effort normal and breath sounds normal. No respiratory distress.   patient has an ingrown toenail as described in HPI.        Assessment & Plan:  1. Ingrown toenail without infection A digital block was performed using  0.1% lidocaine without epinephrine. A tourniquet was applied. The ingrown portion of the toenail was then elevated using a flat metal blade. The ingrown portion of the nail was then removed with gentle traction both medially and laterally.  The wound bed was then covered in Neosporin, petroleum gauze, and coban.  Wound care was discussed. The tourniquet was removed. Total tourniquet time was less than 2 minutes. There was minimal blood loss. The patient tolerated procedure well without complication

## 2013-11-18 NOTE — Addendum Note (Signed)
Addended by: Shary Decamp B on: 11/18/2013 05:06 PM   Modules accepted: Orders

## 2013-12-08 ENCOUNTER — Other Ambulatory Visit: Payer: Self-pay | Admitting: *Deleted

## 2013-12-08 MED ORDER — PRAVASTATIN SODIUM 40 MG PO TABS: 40.0000 mg | ORAL_TABLET | Freq: Every evening | ORAL | Status: DC

## 2013-12-08 NOTE — Telephone Encounter (Signed)
Refill appropriate and filled per protocol. 

## 2013-12-09 ENCOUNTER — Telehealth: Payer: Self-pay | Admitting: Family Medicine

## 2013-12-09 NOTE — Telephone Encounter (Signed)
Patient is calling to get refill on her Seroquel, she will need it today, please call her at [phone]

## 2013-12-09 NOTE — Telephone Encounter (Signed)
Seroquel not on med list???

## 2013-12-12 NOTE — Telephone Encounter (Signed)
I Just reviewed my last office visit note dated 11/09/13. At the beginning of the note I have stated "she sees Dr. Ulanda Edison for gynecology. Also sees mental health on a routine basis." Mental health should be the ones prescribing this Seroquel. Followup with mental health.

## 2014-01-07 ENCOUNTER — Emergency Department (HOSPITAL_COMMUNITY)
Admission: EM | Admit: 2014-01-07 | Discharge: 2014-01-08 | Disposition: A | Discharge status: Other Acute Inpatient Hospital | Payer: Medicaid Other | Attending: Emergency Medicine | Admitting: Emergency Medicine

## 2014-01-07 ENCOUNTER — Encounter (HOSPITAL_COMMUNITY): Payer: Self-pay | Admitting: Emergency Medicine

## 2014-01-07 DIAGNOSIS — Z79899 Other long term (current) drug therapy: Secondary | ICD-10-CM | POA: Insufficient documentation

## 2014-01-07 DIAGNOSIS — F319 Bipolar disorder, unspecified: Secondary | ICD-10-CM | POA: Diagnosis present

## 2014-01-07 DIAGNOSIS — R410 Disorientation, unspecified: Secondary | ICD-10-CM | POA: Diagnosis present

## 2014-01-07 DIAGNOSIS — R45850 Homicidal ideations: Secondary | ICD-10-CM | POA: Insufficient documentation

## 2014-01-07 DIAGNOSIS — R45851 Suicidal ideations: Secondary | ICD-10-CM | POA: Insufficient documentation

## 2014-01-07 DIAGNOSIS — F411 Generalized anxiety disorder: Secondary | ICD-10-CM | POA: Insufficient documentation

## 2014-01-07 DIAGNOSIS — Z1211 Encounter for screening for malignant neoplasm of colon: Secondary | ICD-10-CM

## 2014-01-07 DIAGNOSIS — F329 Major depressive disorder, single episode, unspecified: Secondary | ICD-10-CM | POA: Diagnosis present

## 2014-01-07 DIAGNOSIS — F3013 Manic episode, severe, without psychotic symptoms: Secondary | ICD-10-CM | POA: Insufficient documentation

## 2014-01-07 DIAGNOSIS — R000 Tachycardia, unspecified: Secondary | ICD-10-CM | POA: Insufficient documentation

## 2014-01-07 DIAGNOSIS — E785 Hyperlipidemia, unspecified: Secondary | ICD-10-CM | POA: Insufficient documentation

## 2014-01-07 DIAGNOSIS — H409 Unspecified glaucoma: Secondary | ICD-10-CM | POA: Insufficient documentation

## 2014-01-07 DIAGNOSIS — F32A Depression, unspecified: Secondary | ICD-10-CM

## 2014-01-07 DIAGNOSIS — Z1231 Encounter for screening mammogram for malignant neoplasm of breast: Secondary | ICD-10-CM

## 2014-01-07 DIAGNOSIS — F17200 Nicotine dependence, unspecified, uncomplicated: Secondary | ICD-10-CM | POA: Insufficient documentation

## 2014-01-07 DIAGNOSIS — F314 Bipolar disorder, current episode depressed, severe, without psychotic features: Secondary | ICD-10-CM | POA: Diagnosis present

## 2014-01-07 DIAGNOSIS — F3113 Bipolar disorder, current episode manic without psychotic features, severe: Secondary | ICD-10-CM

## 2014-01-07 DIAGNOSIS — F419 Anxiety disorder, unspecified: Secondary | ICD-10-CM | POA: Diagnosis present

## 2014-01-07 LAB — COMPREHENSIVE METABOLIC PANEL
ALT: 12 U/L (ref 0–35)
AST: 20 U/L (ref 0–37)
Albumin: 4.7 g/dL (ref 3.5–5.2)
Alkaline Phosphatase: 86 U/L (ref 39–117)
BUN: 8 mg/dL (ref 6–23)
CO2: 27 mEq/L (ref 19–32)
Calcium: 9.4 mg/dL (ref 8.4–10.5)
Chloride: 97 mEq/L (ref 96–112)
Creatinine, Ser: 1.1 mg/dL (ref 0.50–1.10)
GFR calc Af Amer: 64 mL/min — ABNORMAL LOW (ref >90)
GFR calc non Af Amer: 55 mL/min — ABNORMAL LOW (ref >90)
Glucose, Bld: 170 mg/dL — ABNORMAL HIGH (ref 70–99)
Potassium: 3.1 mEq/L — ABNORMAL LOW (ref 3.7–5.3)
Sodium: 140 mEq/L (ref 137–147)
Total Bilirubin: 0.3 mg/dL (ref 0.3–1.2)
Total Protein: 7.9 g/dL (ref 6.0–8.3)

## 2014-01-07 LAB — CBC
HCT: 40.9 % (ref 36.0–46.0)
Hemoglobin: 13.9 g/dL (ref 12.0–15.0)
MCH: 32.8 pg (ref 26.0–34.0)
MCHC: 34 g/dL (ref 30.0–36.0)
MCV: 96.5 fL (ref 78.0–100.0)
Platelets: 207 10*3/uL (ref 150–400)
RBC: 4.24 MIL/uL (ref 3.87–5.11)
RDW: 13.4 % (ref 11.5–15.5)
WBC: 10 10*3/uL (ref 4.0–10.5)

## 2014-01-07 LAB — ETHANOL: Alcohol, Ethyl (B): <11 mg/dL (ref 0–11)

## 2014-01-07 LAB — ACETAMINOPHEN LEVEL: Acetaminophen (Tylenol), Serum: <15 ug/mL (ref 10–30)

## 2014-01-07 LAB — SALICYLATE LEVEL: Salicylate Lvl: <2 mg/dL — ABNORMAL LOW (ref 2.8–20.0)

## 2014-01-07 MED ORDER — ZIPRASIDONE MESYLATE 20 MG IM SOLR
INTRAMUSCULAR | Status: AC
  Administered 2014-01-07: 20 mg via INTRAMUSCULAR
  Filled 2014-01-07: qty 20

## 2014-01-07 MED ORDER — STERILE WATER FOR INJECTION IJ SOLN
INTRAMUSCULAR | Status: AC
  Administered 2014-01-07: 1.2 mL
  Filled 2014-01-07: qty 10

## 2014-01-07 MED ORDER — LORAZEPAM 1 MG PO TABS
2.0000 mg | ORAL_TABLET | Freq: Once | ORAL | Status: DC
  Filled 2014-01-07: qty 2

## 2014-01-07 MED ORDER — LORAZEPAM 2 MG/ML IJ SOLN
2.0000 mg | Freq: Once | INTRAMUSCULAR | Status: AC
  Administered 2014-01-07: 2 mg via INTRAMUSCULAR
  Filled 2014-01-07: qty 1

## 2014-01-07 MED ORDER — ZIPRASIDONE MESYLATE 20 MG IM SOLR
20.0000 mg | Freq: Once | INTRAMUSCULAR | Status: AC
  Administered 2014-01-07: 20 mg via INTRAMUSCULAR

## 2014-01-07 NOTE — ED Notes (Signed)
Clinician spoke with patient's RN to initiate tele-assessment. RN stated that patient is screaming and uncooperative at this time. Clinician will attempt to assess patient once patient is de-escalated and cooperative.    Natalie [name]. MSW, LCSW [facility]riage Services-Triage Specialist   Phone: [phone] Fax: [phone]

## 2014-01-07 NOTE — ED Notes (Addendum)
Per Sheriff's office: Pt's spouse called due to the patient being in a manic state. Pt spouse states that she is not taking her bipolar medication. Pt told Sheriff's office that she wanted help and wanted to come to the ED. Pt is making repetitive statements, saying "you're not going to make it" over and over. Pt continues to request to be placed at behavioral health facility. Pt is difficult to direct and it is difficult for the patient to answer questions. Pt denies hallucinations or using recreational drugs.

## 2014-01-07 NOTE — ED Notes (Signed)
Blue knit pants, printed shirt, blue green and white and watch yellow tone face with band

## 2014-01-07 NOTE — BH Assessment (Signed)
Spoke with Dr. Stevie Kern who reported that pt has a history of bipolar and recently had her medication switch to an anti-psychotic. Pt has been non-compliant and has been off her medication for a few days. Patient contacted police on her own requesting help. When patient arrived in the ED pt was screaming and yelling saying that she wanted to kill herself and she wanted to kill others. Pt was given Geodon and Ativan and now has no recollection of the events of earlier today. Pt is now calm and cooperative at this time.

## 2014-01-07 NOTE — ED Notes (Signed)
Pt is scrubbed and wanded ,  Wrist restraints released,  Pt's vital signs evaluated at shift change 02 sats 87% pt placed on 2 L [place]  O2  sats now 93%. B/p  100/52

## 2014-01-07 NOTE — BH Assessment (Signed)
Assessment could not be initiated. Pt is asleep.

## 2014-01-07 NOTE — ED Notes (Signed)
Initial Contact - pt awake, alert, remains yelling/screaming, repetitive, nonsensical.  Intermittently answering simple questions.  Difficult to redirect.  Skin PWD.  No difficulty maintaining airway.  Pt remains restrained at this time.  NAD.

## 2014-01-07 NOTE — ED Provider Notes (Signed)
CSN: [id number]     Arrival date & time 01/07/14  2056 History   First MD Initiated Contact with Patient 01/07/14 2104     Chief Complaint  Patient presents with  . Medical Clearance     (Consider location/radiation/quality/duration/timing/severity/associated sxs/prior Treatment) HPI 53 year old female with history of bipolar disorder reportedly noncompliant with her antipsychotic medications brought to the emergency department restrained by the police after the patient called requesting transport to the emergency department but on the way here to the emergency department patient became anxious agitated yelling trying to fight the police requiring handcuffs stating she wanted to kill herself and others upon arrival to the emergency department she states she wants to kill me during my initial examination with her she appears to be manic with pressured speech flight of ideas repeating phrases and numbers that don't seem to make sense she does not appear to understand that she seems to be mentally ill  with acute psychotic break through she denies hallucinations. She repeatedly yells statements over and over with rapid speech such as you're not going to make it, get money to a mental room, you've got to save Chloe, I figured it out, Palmer, 2020 2020. Past Medical History  Diagnosis Date  . Bipolar 1 disorder   . Smoker   . Glaucoma     stage 2  . Hyperlipidemia   . Allergy     seasonal  . Anxiety     bipolar  . Depression    Past Surgical History  Procedure Laterality Date  . Tubal ligation  1993  . Cosmetic surgery  1967    face after accident   Family History  Problem Relation Age of Onset  . Colon cancer Maternal Grandfather   . Hypertension Father   . Hyperlipidemia Father   . Cancer Neg Hx   . Heart disease Neg Hx    History  Substance Use Topics  . Smoking status: Current Every Day Smoker -- 0.25 packs/day for 15 years    Types: Cigarettes  . Smokeless tobacco:  Current User    Types: Snuff  . Alcohol Use: Not on file   OB History   Grav Para Term Preterm Abortions TAB SAB Ect Mult Living                 Review of Systems  Unable to perform ROS: Psychiatric disorder      Allergies  Sulfa antibiotics; Aspirin; Codeine; Hydrocodone; Mobic; and Tylenol  Home Medications   Prior to Admission medications   Medication Sig Start Date End Date Taking? Authorizing Provider  diphenhydrAMINE (BENADRYL) 25 mg capsule Take 25 mg by mouth every 6 (six) hours as needed. For allergies   Yes Historical Provider, MD  estradiol (ESTRACE) 0.5 MG tablet Take 0.5 mg by mouth daily.   Yes Historical Provider, MD  lamoTRIgine (LAMICTAL) 100 MG tablet Take 100 mg by mouth every morning.    Yes Historical Provider, MD  Multiple Vitamins-Minerals (CENTRUM SILVER ULTRA WOMENS) TABS Take 1 tablet by mouth daily.     Yes Historical Provider, MD  PARoxetine (PAXIL) 20 MG tablet Take 20 mg by mouth every morning.     Yes Historical Provider, MD  pravastatin (PRAVACHOL) 40 MG tablet Take 1 tablet (40 mg total) by mouth every evening. 12/08/13  Yes Susy Frizzle, MD  traMADol (ULTRAM) 50 MG tablet Take 50 mg by mouth every 6 (six) hours as needed for moderate pain.   Yes Historical Provider, MD  BP 154/62  Pulse 79  Temp(Src) 98.6 F (37 C) (Oral)  Resp 23  SpO2 98%  LMP 11/16/2009 Physical Exam  Nursing note and vitals reviewed. Constitutional:  Awake, alert, nontoxic appearance.  HENT:  Head: Atraumatic.  Eyes: Right eye exhibits no discharge. Left eye exhibits no discharge.  Neck: Neck supple.  Cardiovascular: Regular rhythm.   No murmur heard. Tachycardic  Pulmonary/Chest: Effort normal and breath sounds normal. No respiratory distress. She has no wheezes. She has no rales. She exhibits no tenderness.  Abdominal: Soft. Bowel sounds are normal. She exhibits no distension. There is no tenderness. There is no rebound and no guarding.  Musculoskeletal: She  exhibits no tenderness.  Baseline ROM, no obvious new focal weakness.  Neurological: She is alert.  Oriented to person and place close regarding time but still thinks it is May and is now early June  Skin: No rash noted.  Psychiatric:  Screaming yelling and fighting requiring chemical and physical restraints appears manic psychosis with flight of ideas suicidal and homicidal ideation    ED Course  Procedures (including critical care time) IVC forms completed after initial examination. 2130 Pt seen before and after chemical and physical restraints within one hour. The patient still remained agitated and yelling after Geodon so was given Ativan orally and spit it out and then was given Ativan IM and calmed down dramatically. 2245 patient awake alert calm cooperative has amnesia for the events of tonight is now oriented to person place and time denies any threats or herself or others denies any hallucinations admits noncompliance with her new antipsychotic medicine awaiting evaluation by behavioral health consultants.  CRITICAL CARE Performed by: Babette Relic Total critical care time: 35min Critical care time was exclusive of separately billable procedures and treating other patients. Critical care was necessary to treat or prevent imminent or life-threatening deterioration. Critical care was time spent personally by me on the following activities: development of treatment plan with patient and/or surrogate as well as nursing, discussions with consultants, evaluation of patient's response to treatment, examination of patient, obtaining history from patient or surrogate, ordering and performing treatments and interventions, ordering and review of laboratory studies, ordering and review of radiographic studies, pulse oximetry and re-evaluation of patient's condition. Labs Review Labs Reviewed  COMPREHENSIVE METABOLIC PANEL - Abnormal; Notable for the following:    Potassium 3.1 (*)    Glucose,  Bld 170 (*)    GFR calc non Af Amer 55 (*)    GFR calc Af Amer 64 (*)    All other components within normal limits  SALICYLATE LEVEL - Abnormal; Notable for the following:    Salicylate Lvl <1.9 (*)    All other components within normal limits  URINE RAPID DRUG SCREEN (HOSP PERFORMED) - Abnormal; Notable for the following:    Tetrahydrocannabinol POSITIVE (*)    All other components within normal limits  ACETAMINOPHEN LEVEL  CBC  ETHANOL    Imaging Review Dg Humerus Right  01/08/2014   CLINICAL DATA:  Right proximal humerus pain.  EXAM: RIGHT HUMERUS - 2+ VIEW  COMPARISON:  None.  FINDINGS: No acute fracture or dislocation. Visualized portion of the right hemithorax is normal.  IMPRESSION: No acute osseous abnormality.   Electronically Signed   By: Abigail Miyamoto M.D.   On: 01/08/2014 10:16     EKG Interpretation None      MDM   Final diagnoses:  Bipolar disorder with severe mania  Suicidal ideation  Homicidal ideation  Dispo pending. Handoff performed.    Babette Relic, MD 01/10/14 [phone]

## 2014-01-07 NOTE — ED Notes (Signed)
Assessment team attempted to wake pt up but she is snoring and not arousable

## 2014-01-07 NOTE — ED Notes (Signed)
Pt removed from foot restraints, hands remain restrained at this time.  Appears more calm but intermittently uncooperative and yelling.  Will CTM.

## 2014-01-07 NOTE — ED Notes (Signed)
Bed: HY85 Expected date:  Expected time:  Means of arrival:  Comments: Hold for med clearance 53 yo F  Restrained with sheriff

## 2014-01-07 NOTE — ED Notes (Signed)
Received report from [place]  Pt is restrained bilateral wrist,pt is still in street clothing and has not been wanded,.

## 2014-01-08 ENCOUNTER — Encounter (HOSPITAL_COMMUNITY): Payer: Self-pay | Admitting: Registered Nurse

## 2014-01-08 ENCOUNTER — Encounter (HOSPITAL_COMMUNITY): Payer: Self-pay | Admitting: *Deleted

## 2014-01-08 ENCOUNTER — Emergency Department (HOSPITAL_COMMUNITY): Payer: Medicaid Other

## 2014-01-08 ENCOUNTER — Inpatient Hospital Stay (HOSPITAL_COMMUNITY)
Admission: AD | Admit: 2014-01-08 | Discharge: 2014-01-11 | DRG: 885 | Disposition: A | Discharge status: Home / Self Care | Payer: Medicaid Other | Source: Intra-hospital | Attending: Psychiatry | Admitting: Psychiatry

## 2014-01-08 DIAGNOSIS — Z1231 Encounter for screening mammogram for malignant neoplasm of breast: Secondary | ICD-10-CM

## 2014-01-08 DIAGNOSIS — G4700 Insomnia, unspecified: Secondary | ICD-10-CM | POA: Diagnosis present

## 2014-01-08 DIAGNOSIS — Z800 Family history of malignant neoplasm of digestive organs: Secondary | ICD-10-CM

## 2014-01-08 DIAGNOSIS — Z1211 Encounter for screening for malignant neoplasm of colon: Secondary | ICD-10-CM

## 2014-01-08 DIAGNOSIS — E785 Hyperlipidemia, unspecified: Secondary | ICD-10-CM | POA: Diagnosis present

## 2014-01-08 DIAGNOSIS — Z79899 Other long term (current) drug therapy: Secondary | ICD-10-CM

## 2014-01-08 DIAGNOSIS — F3113 Bipolar disorder, current episode manic without psychotic features, severe: Secondary | ICD-10-CM | POA: Diagnosis present

## 2014-01-08 DIAGNOSIS — Z8249 Family history of ischemic heart disease and other diseases of the circulatory system: Secondary | ICD-10-CM

## 2014-01-08 DIAGNOSIS — F410 Panic disorder [episodic paroxysmal anxiety] without agoraphobia: Secondary | ICD-10-CM | POA: Diagnosis present

## 2014-01-08 DIAGNOSIS — F3110 Bipolar disorder, current episode manic without psychotic features, unspecified: Principal | ICD-10-CM | POA: Diagnosis present

## 2014-01-08 DIAGNOSIS — F419 Anxiety disorder, unspecified: Secondary | ICD-10-CM | POA: Diagnosis present

## 2014-01-08 DIAGNOSIS — F411 Generalized anxiety disorder: Secondary | ICD-10-CM | POA: Diagnosis present

## 2014-01-08 DIAGNOSIS — H409 Unspecified glaucoma: Secondary | ICD-10-CM | POA: Diagnosis present

## 2014-01-08 DIAGNOSIS — F1410 Cocaine abuse, uncomplicated: Secondary | ICD-10-CM | POA: Diagnosis present

## 2014-01-08 DIAGNOSIS — F17200 Nicotine dependence, unspecified, uncomplicated: Secondary | ICD-10-CM | POA: Diagnosis present

## 2014-01-08 DIAGNOSIS — E7800 Pure hypercholesterolemia, unspecified: Secondary | ICD-10-CM | POA: Diagnosis present

## 2014-01-08 DIAGNOSIS — F319 Bipolar disorder, unspecified: Secondary | ICD-10-CM | POA: Diagnosis present

## 2014-01-08 DIAGNOSIS — R410 Disorientation, unspecified: Secondary | ICD-10-CM | POA: Diagnosis present

## 2014-01-08 LAB — RAPID URINE DRUG SCREEN, HOSP PERFORMED
Amphetamines: NOT DETECTED
Barbiturates: NOT DETECTED
Benzodiazepines: NOT DETECTED
Cocaine: NOT DETECTED
Opiates: NOT DETECTED
Tetrahydrocannabinol: POSITIVE — AB

## 2014-01-08 MED ORDER — PAROXETINE HCL 20 MG PO TABS: 20.0000 mg | ORAL_TABLET | ORAL | Status: DC

## 2014-01-08 MED ORDER — LAMOTRIGINE 100 MG PO TABS
100.0000 mg | ORAL_TABLET | Freq: Every morning | ORAL | Status: DC
  Filled 2014-01-08: qty 1

## 2014-01-08 MED ORDER — PAROXETINE HCL 20 MG PO TABS
20.0000 mg | ORAL_TABLET | ORAL | Status: DC
  Administered 2014-01-09 – 2014-01-10 (×2): 20 mg via ORAL
  Filled 2014-01-08 (×3): qty 1

## 2014-01-08 MED ORDER — DIPHENHYDRAMINE HCL 25 MG PO CAPS: 25.0000 mg | ORAL_CAPSULE | Freq: Four times a day (QID) | ORAL | Status: DC | PRN

## 2014-01-08 MED ORDER — LORAZEPAM 1 MG PO TABS
ORAL_TABLET | ORAL | Status: AC
  Filled 2014-01-08: qty 2

## 2014-01-08 MED ORDER — QUETIAPINE FUMARATE 100 MG PO TABS
100.0000 mg | ORAL_TABLET | Freq: Every morning | ORAL | Status: DC
  Filled 2014-01-08 (×2): qty 1

## 2014-01-08 MED ORDER — QUETIAPINE FUMARATE 100 MG PO TABS
200.0000 mg | ORAL_TABLET | Freq: Every day | ORAL | Status: DC
  Filled 2014-01-08: qty 2

## 2014-01-08 MED ORDER — SIMVASTATIN 20 MG PO TABS: 20.0000 mg | ORAL_TABLET | Freq: Every day | ORAL | Status: DC

## 2014-01-08 MED ORDER — NICOTINE 21 MG/24HR TD PT24
21.0000 mg | MEDICATED_PATCH | Freq: Every day | TRANSDERMAL | Status: DC
  Administered 2014-01-10: 21 mg via TRANSDERMAL
  Filled 2014-01-08 (×4): qty 1

## 2014-01-08 MED ORDER — ZIPRASIDONE MESYLATE 20 MG IM SOLR
20.0000 mg | Freq: Once | INTRAMUSCULAR | Status: AC
  Administered 2014-01-08: 20 mg via INTRAMUSCULAR
  Filled 2014-01-08 (×2): qty 20

## 2014-01-08 MED ORDER — SIMVASTATIN 20 MG PO TABS
20.0000 mg | ORAL_TABLET | Freq: Every day | ORAL | Status: DC
  Filled 2014-01-08: qty 1

## 2014-01-08 MED ORDER — ESTRADIOL 1 MG PO TABS
0.5000 mg | ORAL_TABLET | Freq: Every day | ORAL | Status: DC
  Administered 2014-01-08 – 2014-01-11 (×4): 0.5 mg via ORAL
  Filled 2014-01-08 (×5): qty 0.5
  Filled 2014-01-08: qty 1

## 2014-01-08 MED ORDER — MAGNESIUM HYDROXIDE 400 MG/5ML PO SUSP: 30.0000 mL | Freq: Every day | ORAL | Status: DC | PRN

## 2014-01-08 MED ORDER — QUETIAPINE FUMARATE 200 MG PO TABS
200.0000 mg | ORAL_TABLET | Freq: Every day | ORAL | Status: DC
  Administered 2014-01-09 – 2014-01-10 (×2): 200 mg via ORAL
  Filled 2014-01-08 (×4): qty 1

## 2014-01-08 MED ORDER — TRAMADOL HCL 50 MG PO TABS: 50.0000 mg | ORAL_TABLET | Freq: Four times a day (QID) | ORAL | Status: DC | PRN

## 2014-01-08 MED ORDER — ADULT MULTIVITAMIN W/MINERALS CH
1.0000 | ORAL_TABLET | Freq: Every day | ORAL | Status: DC
  Filled 2014-01-08: qty 1

## 2014-01-08 MED ORDER — ADULT MULTIVITAMIN W/MINERALS CH
1.0000 | ORAL_TABLET | Freq: Every day | ORAL | Status: DC
  Administered 2014-01-08 – 2014-01-11 (×4): 1 via ORAL
  Filled 2014-01-08 (×6): qty 1

## 2014-01-08 MED ORDER — ESTRADIOL 1 MG PO TABS
0.5000 mg | ORAL_TABLET | Freq: Every day | ORAL | Status: DC
  Filled 2014-01-08: qty 0.5

## 2014-01-08 MED ORDER — LAMOTRIGINE 100 MG PO TABS
100.0000 mg | ORAL_TABLET | Freq: Every morning | ORAL | Status: DC
  Administered 2014-01-09 – 2014-01-11 (×3): 100 mg via ORAL
  Filled 2014-01-08 (×5): qty 1

## 2014-01-08 MED ORDER — QUETIAPINE FUMARATE 200 MG PO TABS
200.0000 mg | ORAL_TABLET | ORAL | Status: AC
  Administered 2014-01-08: 200 mg via ORAL
  Filled 2014-01-08 (×2): qty 1

## 2014-01-08 MED ORDER — LORAZEPAM 1 MG PO TABS
2.0000 mg | ORAL_TABLET | Freq: Once | ORAL | Status: AC
  Administered 2014-01-08: 2 mg via ORAL

## 2014-01-08 MED ORDER — ALUM & MAG HYDROXIDE-SIMETH 200-200-20 MG/5ML PO SUSP: 30.0000 mL | ORAL | Status: DC | PRN

## 2014-01-08 MED ORDER — QUETIAPINE FUMARATE 200 MG PO TABS
ORAL_TABLET | ORAL | Status: AC
  Filled 2014-01-08: qty 1

## 2014-01-08 MED ORDER — ADULT MULTIVITAMIN W/MINERALS CH: 1.0000 | ORAL_TABLET | Freq: Every day | ORAL | Status: DC

## 2014-01-08 MED ORDER — CENTRUM SILVER ULTRA WOMENS PO TABS: 1.0000 | ORAL_TABLET | Freq: Every day | ORAL | Status: DC

## 2014-01-08 MED ORDER — DIPHENHYDRAMINE HCL 50 MG PO CAPS
50.0000 mg | ORAL_CAPSULE | ORAL | Status: AC
  Filled 2014-01-08: qty 1

## 2014-01-08 MED ORDER — TRAMADOL HCL 50 MG PO TABS
50.0000 mg | ORAL_TABLET | Freq: Four times a day (QID) | ORAL | Status: DC | PRN
  Administered 2014-01-08: 50 mg via ORAL
  Filled 2014-01-08: qty 1

## 2014-01-08 MED ORDER — DIPHENHYDRAMINE HCL 25 MG PO CAPS
ORAL_CAPSULE | ORAL | Status: AC
  Administered 2014-01-08: 19:00:00
  Filled 2014-01-08: qty 2

## 2014-01-08 MED ORDER — SIMVASTATIN 5 MG PO TABS
5.0000 mg | ORAL_TABLET | Freq: Every day | ORAL | Status: DC
  Administered 2014-01-09 – 2014-01-10 (×2): 5 mg via ORAL
  Filled 2014-01-08 (×3): qty 1

## 2014-01-08 MED ORDER — ACETAMINOPHEN 325 MG PO TABS
650.0000 mg | ORAL_TABLET | Freq: Four times a day (QID) | ORAL | Status: DC | PRN
  Administered 2014-01-08 – 2014-01-09 (×2): 650 mg via ORAL
  Filled 2014-01-08 (×2): qty 2

## 2014-01-08 MED ORDER — ZIPRASIDONE MESYLATE 20 MG IM SOLR
INTRAMUSCULAR | Status: AC
  Filled 2014-01-08: qty 20

## 2014-01-08 MED ORDER — QUETIAPINE FUMARATE 100 MG PO TABS
100.0000 mg | ORAL_TABLET | Freq: Every morning | ORAL | Status: DC
  Administered 2014-01-08: 100 mg via ORAL

## 2014-01-08 MED ORDER — NICOTINE 21 MG/24HR TD PT24
21.0000 mg | MEDICATED_PATCH | Freq: Every day | TRANSDERMAL | Status: DC
  Administered 2014-01-08: 21 mg via TRANSDERMAL
  Filled 2014-01-08: qty 1

## 2014-01-08 NOTE — Progress Notes (Signed)
Patient admitted to obs unit. Paranoid during search, comfortable with this Probation officer but unwilling to communicate with other staff present. Bruises on forearms from restraints in ED.  Become extremely agitated once on the unit. Could not sit still, intruded in another patients space frightening the other patient.  Suspicious of all staff on unit and would only communicate with this writer stating "you are the only one who understands me and believes me and will help me". When she looked at other staff she quickly averted her eyes and would look only at this Probation officer. Would not take redirection from other staff and became extremely irritated and glared at them angrily. AC came onto unit and she became highly agitated. AC took her to 400 hall.

## 2014-01-08 NOTE — ED Notes (Signed)
Pt eating breakfast 

## 2014-01-08 NOTE — ED Notes (Addendum)
Additional patient belongings placed in locker 29. (Shoes, shirt, pants)

## 2014-01-08 NOTE — ED Notes (Signed)
Charge talked to Psych PA, IVC papers have been resended. Attempted to call report to observation unit. They will call back to get report.

## 2014-01-08 NOTE — ED Notes (Signed)
Report given to Maudie Mercury, rn at observation unit

## 2014-01-08 NOTE — ED Notes (Addendum)
Pt sleeping quietly at this time. Pt being monitored and vss. Pt has even, unlabored resp. Sitter at bedside. Will continue to monitor

## 2014-01-08 NOTE — ED Notes (Signed)
Gillis Ends (husband) [phone]. Home [phone]

## 2014-01-08 NOTE — Consult Note (Signed)
  Patient husband at bedside and states that patient started to have problems "2 weeks ago when her medicine was changed.  The Seroquel was stopped and she was started on two other medicines.  The changes was coming on a little bit at a time and then yesterday she just snapped, she was yelling and screaming.  She is doing a lot better today."  Patient continues to be confused but oriented to person, place, time and situation.  Patient would like to stat back on her Seroquel.  Patient states that she asked the doctor to change because of weight gain but it works and she would like to go back to previous medication.  Will start patient on Seroquel 100 mg in morning and 200 mg Q hs.  D/C Latuda.    Patient has been accepted to [facility].  Monitor patient response to Seroquel.  If patient continues to need placement.  Patient is appropriate for a 300 hall or a 500 hall bed.    Agree with TTS assessment.    Shuvon B. Rankin FNP-BC  Patient was seen face-to-face for psychiatric evaluation, case discussed with the physician extender/ nurse practitioner andReviewed the information documented and agree with the treatment plan.  Parke Simmers Hosanna Betley 01/08/2014 5:00 PM

## 2014-01-08 NOTE — ED Notes (Signed)
Natalie [name], ACT- states that Dr. Lenna Sciara (psych) wants to keep pt overnight because she is acting confused. Will notify if pt can be held in psych or if she needs to be monitored medically

## 2014-01-08 NOTE — ED Notes (Signed)
Attempted to call report, [place] requesting that IVC papers be rescinded before report is given.

## 2014-01-08 NOTE — ED Notes (Addendum)
Psychiatry at bedside.

## 2014-01-08 NOTE — ED Notes (Signed)
Pt husband called wanting information about pt. Husband was advised that pt is fine, calm and has eaten breakfast. Husband states that he will come soon to visit

## 2014-01-08 NOTE — Progress Notes (Signed)
Psychoeducational Group Note  Date:  01/08/2014 Time:  2135  Group Topic/Focus:  Wrap-Up Group:   The focus of this group is to help patients review their daily goal of treatment and discuss progress on daily workbooks.  Participation Level: Did Not Attend  Participation Quality:  Not Applicable  Affect:  Not Applicable  Cognitive:  Not Applicable  Insight:  Not Applicable  Engagement in Group: Not Applicable  Additional Comments:  The patient did not attend group since she was agitated at that time.   Gennette Pac 01/08/2014, 9:36 PM

## 2014-01-08 NOTE — ED Notes (Addendum)
Pt c/o right shoulder pain. Pain increases with movement. Will make MD aware.

## 2014-01-08 NOTE — Tx Team (Signed)
Initial Interdisciplinary Treatment Plan  PATIENT STRENGTHS: (choose at least two) Average or above average intelligence General fund of knowledge Physical Health Supportive family/friends  PATIENT STRESSORS: Medication change or noncompliance   PROBLEM LIST: Problem List/Patient Goals Date to be addressed Date deferred Reason deferred Estimated date of resolution  Mania 01/08/14                                                      DISCHARGE CRITERIA:  Improved stabilization in mood, thinking, and/or behavior Need for constant or close observation no longer present Verbal commitment to aftercare and medication compliance  PRELIMINARY DISCHARGE PLAN: Outpatient therapy Return to previous living arrangement  PATIENT/FAMIILY INVOLVEMENT: This treatment plan has been presented to and reviewed with the patient, Natalie [name], and/or family member.  The patient and family have been given the opportunity to ask questions and make suggestions.  Junius Creamer Natalie [name] 01/08/2014, 9:58 PM

## 2014-01-08 NOTE — ED Notes (Signed)
Patient asleep at this time.

## 2014-01-08 NOTE — ED Notes (Signed)
Eric from [facility] called and reports they are awaiting discharges and bed availability for patient to transfer there.

## 2014-01-08 NOTE — ED Notes (Signed)
Pt belongings placed in locker #29 in [facility]

## 2014-01-08 NOTE — BH Assessment (Signed)
Assessment Note  Natalie [name] is an 53 y.o. female.   Pt brought to ED by police and while in process, pt became combative with police using threatening language resulting in hand cuffs.  Pt at the time was making suicidal statements.    Pt at the time of the assessment at 0900 has poor recall on the events and denies any intent or desire or thoughts to harm self or others.    Pt appears to have memory issues:  Pt was using the surgical gloves on the wall of her room listed S, M, L (identifying their sizes) as a way to remember her name "See my name starts with a L for Nicolemarie and that L on the wall is how I keep up with it.  I do this all the time."  Pt recall of events is difficult for pt to recall.  Pt remembers being handcuff by police and remembers being considered about her granddaughter.  Pt reports "I haven't been good with taking Seroquel because I have been gaining weight."  Pt responds periodically, "I have done it again.  Messed things up.  I am so sorry.  I don't know sometimes what I am doing."  When TTS ask for clarification pt cannot provide any insight or details to explain statements.    MSE by TTS:  Pt interacted politely, speech clear but tangential, tearful, restless, arms bruised, disheveled in appearance, well nourished, recall impaired  Suicidality:  Pt denies any desire to harm self and past hx of harming self  Homicidally:  Pt denies any desire to harm others or past hx of  SA:  Pt denies any issues with substances  Nurses indicated husband has called and is requesting pt to be released if the ER believes she is safe to do so.  Recommendation:  Psychiatry to round on pt and make final determination on dispo of pt.  Pt is under IVC and if she is discharged home psychiatry will need to release pt from petition.  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 Bipolar 1 disorder   .  Smoker   . Glaucoma     stage 2  . Hyperlipidemia   . Allergy     seasonal  . Anxiety     bipolar  . Depression    Axis IV: other psychosocial or environmental problems, problems related to social environment and problems with primary support group Axis V: 41-50 serious symptoms  Past Medical History:  Past Medical History  Diagnosis Date  . Bipolar 1 disorder   . Smoker   . Glaucoma     stage 2  . Hyperlipidemia   . Allergy     seasonal  . Anxiety     bipolar  . Depression     Past Surgical History  Procedure Laterality Date  . Tubal ligation  1993  . Cosmetic surgery  1967    face after accident    Family History:  Family History  Problem Relation Age of Onset  . Colon cancer Maternal Grandfather   . Hypertension Father   . Hyperlipidemia Father   . Cancer Neg Hx   . Heart disease Neg Hx     Social History:  reports that she has been smoking Cigarettes.  She has been smoking about 1.00 pack per day. She has never used smokeless tobacco. She reports that she does not drink alcohol or  use illicit drugs.  Additional Social History:  Alcohol / Drug Use Pain Medications: na Prescriptions: na Over the Counter: na History of alcohol / drug use?: No history of alcohol / drug abuse  CIWA: CIWA-Ar BP: 99/79 mmHg Pulse Rate: 72 COWS:    Allergies:  Allergies  Allergen Reactions  . Sulfa Antibiotics Anaphylaxis and Swelling     flu like sx  . Aspirin Other (See Comments)    Has ulcers  . Codeine Nausea And Vomiting and Other (See Comments)    Stomach pain  . Hydrocodone Nausea And Vomiting  . Mobic [Meloxicam] Nausea And Vomiting  . Tylenol [Acetaminophen] Other (See Comments)    Has ulcers    Home Medications:  (Not in a hospital admission)  OB/GYN Status:  Patient's last menstrual period was 11/16/2009.  General Assessment Data Location of Assessment: [facility] ED Is this a Tele or Face-to-Face Assessment?: Face-to-Face Is this an Initial Assessment  or a Re-assessment for this encounter?: Initial Assessment Living Arrangements: Spouse/significant other Can pt return to current living arrangement?: Yes Admission Status: Involuntary Is patient capable of signing voluntary admission?: Yes Transfer from: [facility] Referral Source: MD  Medical Screening Exam ([facility]) Medical Exam completed: Yes  [place] Living Arrangements: Spouse/significant other Name of Psychiatrist: na Name of Therapist: na  Education Status Is patient currently in school?: No Current Grade: na Highest grade of school patient has completed: na Name of school: na Contact person: na  Risk to self Suicidal Ideation: No-Not Currently/Within Last 6 Months Suicidal Intent: No Is patient at risk for suicide?: No Suicidal Plan?: No Access to Means: No What has been your use of drugs/alcohol within the last 12 months?: na Previous Attempts/Gestures: No How many times?: 0 Other Self Harm Risks: na Triggers for Past Attempts: None known Intentional Self Injurious Behavior: None Family Suicide History: No Recent stressful life event(s): Other (Comment) (off meds) Persecutory voices/beliefs?: No Depression: Yes Depression Symptoms: Tearfulness;Isolating;Fatigue;Guilt;Loss of interest in usual pleasures;Feeling worthless/self pity;Feeling angry/irritable Substance abuse history and/or treatment for substance abuse?: No Suicide prevention information given to non-admitted patients: Not applicable  Risk to Others Homicidal Ideation: No Thoughts of Harm to Others: No Current Homicidal Intent: No Current Homicidal Plan: No Access to Homicidal Means: No Identified Victim: na History of harm to others?: No Assessment of Violence: On admission Violent Behavior Description: aggressive with police on the way to hospital Does patient have access to weapons?: No Criminal Charges Pending?: No Does patient have a court date:  No  Psychosis Hallucinations: None noted Delusions: Unspecified  Mental Status Report Appear/Hygiene: Disheveled Eye Contact: Fair Motor Activity: Restlessness Speech: Tangential Level of Consciousness: Alert Mood: Depressed;Anxious;Ashamed/humiliated;Sad;Worthless, low self-esteem;Terrified Affect: Anxious;Depressed;Fearful;Sad Anxiety Level: Severe Thought Processes: Relevant;Tangential Judgement: Impaired Orientation: Person;Time;Situation Obsessive Compulsive Thoughts/Behaviors: Minimal  Cognitive Functioning Concentration: Decreased Memory: Recent Impaired;Remote Intact IQ: Average Insight: Poor Impulse Control: Poor Appetite: Fair Weight Loss: 0 Weight Gain: 0 Sleep: Decreased Total Hours of Sleep: 5 Vegetative Symptoms: None  ADLScreening [facility] Assessment Services) Patient's cognitive ability adequate to safely complete daily activities?: Yes Patient able to express need for assistance with ADLs?: Yes Independently performs ADLs?: Yes (appropriate for developmental age)  Prior Inpatient Therapy Prior Inpatient Therapy: No Prior Therapy Dates: na Prior Therapy Facilty/Provider(s): na Reason for Treatment: na  Prior Outpatient Therapy Prior Outpatient Therapy: No Prior Therapy Dates: na Prior Therapy Facilty/Provider(s): na Reason for Treatment: na  ADL Screening (condition at time of admission) Patient's cognitive ability adequate to  safely complete daily activities?: Yes Is the patient deaf or have difficulty hearing?: No Does the patient have difficulty seeing, even when wearing glasses/contacts?: No Does the patient have difficulty concentrating, remembering, or making decisions?: Yes Patient able to express need for assistance with ADLs?: Yes Does the patient have difficulty dressing or bathing?: No Independently performs ADLs?: Yes (appropriate for developmental age) Does the patient have difficulty walking or climbing stairs?: No Weakness of Legs:  None Weakness of Arms/Hands: None  Home Assistive Devices/Equipment Home Assistive Devices/Equipment: None  Therapy Consults (therapy consults require a physician order) PT Evaluation Needed: No OT Evalulation Needed: No SLP Evaluation Needed: No Abuse/Neglect Assessment (Assessment to be complete while patient is alone) Physical Abuse: Denies Verbal Abuse: Denies Sexual Abuse: Denies Exploitation of patient/patient's resources: Denies Self-Neglect: Denies Values / Beliefs Cultural Requests During Hospitalization: None Spiritual Requests During Hospitalization: None Consults Spiritual Care Consult Needed: No Social Work Consult Needed: No Regulatory affairs officer (For Healthcare) Advance Directive: Patient does not have advance directive Pre-existing out of facility DNR order (yellow form or pink MOST form): No    Additional Information 1:1 In Past 12 Months?: No CIRT Risk: No Elopement Risk: No Does patient have medical clearance?: Yes     Disposition:  Disposition Initial Assessment Completed for this Encounter: Yes Disposition of Patient: Referred to (psychiatry to round on pt) Patient referred to: Other (Comment) (psychiatry to round on pt)  On Site Evaluation by:   Reviewed with Physician:    John Giovanni. 01/08/2014 9:37 AM

## 2014-01-08 NOTE — ED Notes (Signed)
Waiting for transport

## 2014-01-08 NOTE — BH Assessment (Signed)
[facility] Assessment Progress Note  TTS asked by Lyda Jester to complete IVC rescind form.  Pt has been accepted to OBS unit but cannot come under IVC.  Unable to locate Dr Linus Orn.  Dr. Hillard Danker of [facility] agreed to sign form, which was then faxed to magistrate by Cristie Hem, SW. Lurline Idol, LCSW

## 2014-01-08 NOTE — Progress Notes (Signed)
Pt emergently moved from [facility] obs unit to inpatient 400 hall (see obs RN note) after presenting to the ED in manic, disorganized and paranoid state. While in ED patient required chemical and physical restraints. Patient has had recent med changes as well as a period of noncompliance resulting in her decompensation. Once on 400 hall, patient was given po meds by [facility] (see eMAR) however pt remained labile, agitated and was attempting to elope. PA on call paged and orders received for geodon 20mg  IM which was given along with a great deal of reassurance and support. Pt remains confused, disoriented. Complaining of R arm pain of a 10/10 from geodon given IM last evening as well as self admittedly fighting the arm restraints while in the ED. Attempted to complete as much of admit process as patient could tolerate however EKG and paper work will need to be completed when patient is calmer and able to better process information. Pt is finally asleep at this time and remains on level III obs for safety. Denies SI/HI. Junius Creamer Tommi Rumps

## 2014-01-08 NOTE — ED Provider Notes (Signed)
Pt will voluntarily agree to admission to [facility] obs unit.  Involuntary commitment will be rescinded.    Dorie Rank, MD 01/08/14 [phone]

## 2014-01-08 NOTE — ED Notes (Signed)
PA Shavon called for medication orders on patient. She informs this RN that she will place orders shortly.

## 2014-01-08 NOTE — ED Notes (Signed)
Pt ate 50% of her food.

## 2014-01-08 NOTE — ED Notes (Signed)
TSS at bedside 

## 2014-01-08 NOTE — ED Notes (Signed)
Patient ate 25% of her lunch at this time.

## 2014-01-09 DIAGNOSIS — F3110 Bipolar disorder, current episode manic without psychotic features, unspecified: Principal | ICD-10-CM

## 2014-01-09 MED ORDER — PAROXETINE HCL ER 12.5 MG PO TB24
12.5000 mg | ORAL_TABLET | Freq: Every day | ORAL | Status: DC
  Administered 2014-01-10 – 2014-01-11 (×2): 12.5 mg via ORAL
  Filled 2014-01-09 (×3): qty 1

## 2014-01-09 NOTE — BHH Group Notes (Signed)
[facility] LCSW Aftercare Discharge Planning Group Note   01/09/2014 10:59 AM  Participation Quality:  Minimal  Mood/Affect:  Tearful  Depression Rating:  unk  Anxiety Rating:  unk  Thoughts of Suicide:  No Will you contract for safety?   NA  Current AVH:  No  Plan for Discharge/Comments:  Pt was called out soon after group started to see Dr.  Was crying when she returned.  Did not want to divulge the nature of her visit with Dr.  Transportation Means: unk  Supports: unk  Trish Mage

## 2014-01-09 NOTE — Progress Notes (Signed)
Pt presents with flat affect and depressed mood. Pt observed having crying spells off and on this morning. Pt has poor insight and continues to request for discharge. Pt easily agitated because she do not want to be here. Pt labile and threatening when she becomes angry.  Pt isolates in her room. When encouraged to go to groups, pt has made an effort to attend. Pt thoughts are disorganized and speech is tangential. Pt c/o right arm pain d/t an injection that she received the other day.   Medications administered as ordered per MD. Verbal support given. Pt encouraged to attend groups. 15 minute checks performed for safety. Pt safety maintained at this time.
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
## 2014-01-09 NOTE — BHH Group Notes (Signed)
[facility] LCSW Group Therapy  01/09/2014 1:15 pm  Type of Therapy: Process Group Therapy  Participation Level:  Active  Participation Quality:  Appropriate  Affect:  Tearful  Cognitive:  Oriented  Insight: Limited  Engagement in Group:  Limited  Engagement in Therapy:  Limited  Modes of Intervention:  Activity, Clarification, Education, Problem-solving and Support  Summary of Progress/Problems: Today's group addressed the issue of overcoming obstacles.  Patients were asked to identify their biggest obstacle post d/c that stands in the way of their on-going success, and then problem solve as to how to manage this.  Kimberly identified many obstacles, including a husband who is struggling with throat cancer, feeling responsible for her granddaughter and much drama surrounding her daughter, including throwing things at [place] when angry and threatening to not let her see her granddaughter.  Furthermore, Kaelen talked about a cousin who recently committed suicide by jumping off a bridge.  Others attempted to give her feedback about her daughter, but Diandra was unable to hear it, saying "that's my baby."  Others also commented on how many challenges she has, and commended her for doing as well as she has.  She appreciated and responded to those comments.  Roque Lias B 01/09/2014   3:05 PM

## 2014-01-09 NOTE — Progress Notes (Signed)
The focus of this group is to help patients review their daily goal of treatment and discuss progress on daily workbooks. Pt attended the evening group session and responded to all discussion prompts from the [facility]. Pt shared that today was a good day on the unit, the highlight of which was a visit from her husband, whom she considers supportive of her seeking treatment. Pt's only request from staff tonight was to pray for her and her husband, whom Pt shares is having surgery tomorrow. Pt's affect was appropriate in group, though became tearful when discussing her husband.

## 2014-01-09 NOTE — BHH Suicide Risk Assessment (Signed)
   Nursing information obtained from:  Review of record Demographic factors:  Caucasian Current Mental Status:   (none) Loss Factors:   (none) Historical Factors:  Impulsivity Risk Reduction Factors:  Sense of responsibility to family;Living with another person, especially a relative;Positive social support;Positive therapeutic relationship Total Time spent with patient: 30 minutes  CLINICAL FACTORS:   Severe Anxiety and/or Agitation Bipolar Disorder: mixed phase Depression:   Aggression Delusional Impulsivity Insomnia Unstable or Poor Therapeutic Relationship  Psychiatric Specialty Exam: Physical Exam  Psychiatric: Her mood appears anxious. Her affect is labile. Her speech is rapid and/or pressured and tangential. She is agitated and combative. Thought content is paranoid and delusional. Cognition and memory are normal. She expresses impulsivity.    Review of Systems  Constitutional: Negative.   HENT: Negative.   Eyes: Negative.   Respiratory: Negative.   Cardiovascular: Negative.   Gastrointestinal: Negative.   Genitourinary: Negative.   Musculoskeletal: Positive for joint pain.  Skin: Negative.   Neurological: Negative.   Endo/Heme/Allergies: Negative.   Psychiatric/Behavioral: Positive for depression. The patient is nervous/anxious and has insomnia.     Blood pressure 116/74, pulse 93, temperature 98.3 F (36.8 C), temperature source Oral, resp. rate 16, height 5' 2.25" (1.581 m), weight 71.782 kg (158 lb 4 oz), last menstrual period 11/16/2009.Body mass index is 28.72 kg/(m^2).  General Appearance: Fairly Groomed  Engineer, water::  Good  Speech:  Pressured  Volume:  Increased  Mood:  Angry and Irritable  Affect:  Labile  Thought Process:  Disorganized and Tangential  Orientation:  Full (Time, Place, and Person)  Thought Content:  Delusions  Suicidal Thoughts:  No  Homicidal Thoughts:  No  Memory:  Immediate;   Fair Recent;   Fair Remote;   Fair  Judgement:   Impaired  Insight:  Lacking  Psychomotor Activity:  Increased  Concentration:  Poor  Recall:  [place]: Fair  Akathisia:  No  Handed:  Right  AIMS (if indicated):     Assets:  Communication Skills Desire for Improvement  Sleep:  Number of Hours: 6.75   Musculoskeletal: Strength & Muscle Tone: within normal limits Gait & Station: normal Patient leans: N/A  COGNITIVE FEATURES THAT CONTRIBUTE TO RISK:  Closed-mindedness Polarized thinking    SUICIDE RISK:   Minimal: No identifiable suicidal ideation.  Patients presenting with no risk factors but with morbid ruminations; may be classified as minimal risk based on the severity of the depressive symptoms  PLAN OF CARE:1. Admit for crisis management and stabilization. 2. Medication management to reduce current symptoms to base line and improve the  patient's overall level of functioning 3. Treat health problems as indicated. 4. Develop treatment plan to decrease risk of relapse upon discharge and the need for readmission. 5. Psycho-social education regarding relapse prevention and self care. 6. Health care follow up as needed for medical problems. 7. Restart home medications where appropriate.   I certify that inpatient services furnished can reasonably be expected to improve the patient's condition.  Corena Pilgrim, MD 01/09/2014, 9:59 AM

## 2014-01-10 LAB — BASIC METABOLIC PANEL
BUN: 8 mg/dL (ref 6–23)
CO2: 28 mEq/L (ref 19–32)
Calcium: 9.7 mg/dL (ref 8.4–10.5)
Chloride: 97 mEq/L (ref 96–112)
Creatinine, Ser: 0.82 mg/dL (ref 0.50–1.10)
GFR calc Af Amer: >90 mL/min (ref >90)
GFR calc non Af Amer: 79 mL/min — ABNORMAL LOW (ref >90)
Glucose, Bld: 101 mg/dL — ABNORMAL HIGH (ref 70–99)
Potassium: 3.6 mEq/L — ABNORMAL LOW (ref 3.7–5.3)
Sodium: 138 mEq/L (ref 137–147)

## 2014-01-10 MED ORDER — IBUPROFEN 200 MG PO TABS
600.0000 mg | ORAL_TABLET | Freq: Three times a day (TID) | ORAL | Status: DC | PRN
  Administered 2014-01-10: 600 mg via ORAL
  Filled 2014-01-10: qty 3

## 2014-01-10 NOTE — Progress Notes (Signed)
D   Pt is pleasant on approach and interacts well with others   She is anxious and worried over her husband who is due to have throat surgery soon   She participates in unit activities and is compliant with treatment  Her mood and behaviors are more stable and her thinking has become mostly logical and coherent A   Verbal support given   Medications administered and effectiveness monitored   Q 15 min checks R   Pt safe at present
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
## 2014-01-10 NOTE — Progress Notes (Signed)
Pt presents with flat affect and depressed mood. Pt reports decreasing depression. Pt reports feeling sad because her husband has a throat cancer and that she also helps to raise her granddaughter, who is now sick.  No SI/HI/AVH verbalized by pt. Pt compliant with taking meds and attending groups. Pt c/o right arm feeling sore d/t IM injection that she received on Sunday. Medications administered as ordered per MD. Verbal support given. Pt encouraged to attend groups. Pt encouraged to exercise right arm. Np made aware of pt complaint. 15 minute checks performed for safety. Pt safety maintained at this time.

## 2014-01-10 NOTE — Progress Notes (Signed)
D   Pt is pleasant on approach  She continues to display signs of anxiety and depression but her mood is much improved and her thinking is more logical   Pt reports her husband came to see her today   She had said yesterday that her husband was going for surgery today but was obviously mistaken about that   She interacts appropriately with others A   Verbal support given   Medications administered and effectiveness monitored   Q 15 min checks R   Pt safe at present
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
## 2014-01-10 NOTE — Progress Notes (Signed)
[facility] MD Progress Note  01/10/2014 1:31 PM Natalie [name]  MRN:  [id number] Subjective:   Patient states "I get angry sometimes. I get frustrated with my daughter. I was doing good on Seroquel but kept gaining weight. I was trying to cut back on sodas to stop the weight gain. But my mood was stable on Seroquel. I was having to buy larger clothes. I was put on Saphris but only took for four days. I think I was running around out behind the house. I guess I got manic."  Objective:  Patient is visible on the unit and attending the scheduled groups. She becomes tearful today when discussing stressors related to her family. Nursing staff report that she appears depressed on the unit. Patient admits to feeling very agitated several days ago prompting her to receive Geodon injection. She now appears to be in a depressive phase of Bipolar since receiving mood stabilizing medications. Patient some insight into events leading to admission by realizing not being on psychiatric medications led to acute Bipolar symptoms. Patient is compliant with medications. She is pleasant during interaction with Probation officer today.   Diagnosis:   DSM5: Total Time spent with patient: 30 minutes  Axis I: Bipolar, Manic Axis II: Deferred Axis III:  Past Medical History  Diagnosis Date  . Bipolar 1 disorder   . Smoker   . Glaucoma     stage 2  . Hyperlipidemia   . Allergy     seasonal  . Anxiety     bipolar  . Depression    Axis IV: other psychosocial or environmental problems Axis V: 41-50 serious symptoms  ADL's:  Intact  Sleep: Fair  Appetite:  Good  Suicidal Ideation:  Denies Homicidal Ideation:  Denies AEB (as evidenced by):  Psychiatric Specialty Exam: Physical Exam  Review of Systems  Constitutional: Negative.   HENT: Negative.   Eyes: Negative.   Respiratory: Negative.   Cardiovascular: Negative.   Gastrointestinal: Negative.   Genitourinary: Negative.   Musculoskeletal: Positive for myalgias  (Patient complains of tenderness to right deltoid area from prior Geodon injection. ).  Skin: Negative.   Neurological: Negative.   Endo/Heme/Allergies: Negative.   Psychiatric/Behavioral: Positive for depression and substance abuse (UDS positive for marijuana. ). The patient is nervous/anxious.     Blood pressure 140/79, pulse 95, temperature 97.8 F (36.6 C), temperature source Oral, resp. rate 16, height 5' 2.25" (1.581 m), weight 71.782 kg (158 lb 4 oz), last menstrual period 11/16/2009.Body mass index is 28.72 kg/(m^2).  General Appearance: Casual  Eye Contact::  Good  Speech:  Clear and Coherent  Volume:  Normal  Mood:  Depressed  Affect:  Tearful  Thought Process:  Circumstantial  Orientation:  Full (Time, Place, and Person)  Thought Content:  Rumination  Suicidal Thoughts:  No  Homicidal Thoughts:  No  Memory:  Immediate;   Fair Recent;   Fair Remote;   Poor  Judgement:  Fair  Insight:  Shallow  Psychomotor Activity:  Normal  Concentration:  Good  Recall:  Good  Fund of Knowledge:Fair  Language: Good  Akathisia:  No  Handed:  Right  AIMS (if indicated):     Assets:  Desire for Improvement Intimacy Leisure Time Physical Health Resilience Social Support  Sleep:  Number of Hours: 6.75   Musculoskeletal: Strength & Muscle Tone: within normal limits Gait & Station: normal Patient leans: N/A  Current Medications: Current Facility-Administered Medications  Medication Dose Route Frequency Provider Last Rate Last Dose  . acetaminophen (TYLENOL)  tablet 650 mg  650 mg Oral Q6H PRN Nena Polio, PA-C   650 mg at 01/09/14 1712  . alum & mag hydroxide-simeth (MAALOX/MYLANTA) 200-200-20 MG/5ML suspension 30 mL  30 mL Oral Q4H PRN Nena Polio, PA-C      . diphenhydrAMINE (BENADRYL) capsule 25 mg  25 mg Oral Q6H PRN Nena Polio, PA-C      . estradiol (ESTRACE) tablet 0.5 mg  0.5 mg Oral Daily Shuvon Rankin, NP   0.5 mg at 01/10/14 0736  . lamoTRIgine (LAMICTAL) tablet  100 mg  100 mg Oral q morning - 10a Shuvon Rankin, NP   100 mg at 01/10/14 1256  . multivitamin with minerals tablet 1 tablet  1 tablet Oral Daily Shuvon Rankin, NP   1 tablet at 01/10/14 0736  . nicotine (NICODERM CQ - dosed in mg/24 hours) patch 21 mg  21 mg Transdermal Daily Shuvon Rankin, NP   21 mg at 01/10/14 0736  . PARoxetine (PAXIL-CR) 24 hr tablet 12.5 mg  12.5 mg Oral Daily Grethel Zenk   12.5 mg at 01/10/14 0736  . QUEtiapine (SEROQUEL) tablet 200 mg  200 mg Oral QHS Shuvon Rankin, NP   200 mg at 01/09/14 2119  . simvastatin (ZOCOR) tablet 5 mg  5 mg Oral q1800 Nena Polio, PA-C   5 mg at 01/09/14 1711    Lab Results: No results found for this or any previous visit (from the past 48 hour(s)).  Physical Findings: AIMS: Facial and Oral Movements Muscles of Facial Expression: None, normal Lips and Perioral Area: None, normal Jaw: None, normal Tongue: None, normal,Extremity Movements Upper (arms, wrists, hands, fingers): None, normal Lower (legs, knees, ankles, toes): None, normal, Trunk Movements Neck, shoulders, hips: None, normal, Overall Severity Severity of abnormal movements (highest score from questions above): None, normal Incapacitation due to abnormal movements: None, normal Patient's awareness of abnormal movements (rate only patient's report): No Awareness, Dental Status Current problems with teeth and/or dentures?: Yes Does patient usually wear dentures?: No  CIWA:    COWS:     Treatment Plan Summary: Daily contact with patient to assess and evaluate symptoms and progress in treatment Medication management  Plan: 1. Continue crisis management and stabilization.  2. Medication management:  -Continue Lamictal 100 mg daily for improved mood stability -Continue Seroquel 200 mg hs for mood control -Continue Paxil CR 12.5 mg daily for depression/anxiety.  3. Encouraged patient to attend groups and participate in group counseling sessions and activities.  4.  Discharge plan in progress.  5. Continue current treatment plan.  6. Address health issues: Continue Zocor 5 mg daily for elevated cholesterol. Start motrin 600 mg every eight hours for right deltoid discomfort. Draw BMP to evaluate for low potassium.   Medical Decision Making Problem Points:  Established problem, stable/improving (1), Review of last therapy session (1) and Review of psycho-social stressors (1) Data Points:  Review of medication regiment & side effects (2) Review of new medications or change in dosage (2)  I certify that inpatient services furnished can reasonably be expected to improve the patient's condition.   Elmarie Shiley NP-C 01/10/2014, 1:31 PM  Patient seen, evaluated and I agree with notes by Nurse Practitioner. Corena Pilgrim, MD

## 2014-01-10 NOTE — Progress Notes (Signed)
NUTRITION ASSESSMENT  Pt identified as at risk on the Malnutrition Screen Tool  INTERVENTION: 1. Educated patient on the importance of nutrition and encouraged intake of food and beverages. 2. Discussed weight goals. 3. Supplements: MVI daily.  zocor noted.  NUTRITION DIAGNOSIS: Unintentional weight loss related to sub-optimal intake as evidenced by pt report.   Goal: Pt to meet >/= 90% of their estimated nutrition needs.  Monitor:  PO intake  Assessment:  Patient admitted with Bipolar Affective Disorder-manic.  Husband with throat cancer.  Patient reports eating well currently with good appetite.  Good intake generally prior to admit and verbalized healthy eating for weight control.  States UBW 135-170 over the past 10 years.  NOted 11 lb weight loss over the past 2 months.  53 y.o. female  Height: Ht Readings from Last 1 Encounters:  01/08/14 5' 2.25" (1.581 m)    Weight: Wt Readings from Last 1 Encounters:  01/08/14 158 lb 4 oz (71.782 kg)    Weight Hx: Wt Readings from Last 10 Encounters:  01/08/14 158 lb 4 oz (71.782 kg)  11/18/13 169 lb (76.658 kg)  11/09/13 164 lb (74.39 kg)  10/20/13 168 lb (76.204 kg)  03/11/13 151 lb (68.493 kg)  04/29/11 141 lb (63.957 kg)  04/10/11 141 lb (63.957 kg)    BMI:  Body mass index is 28.72 kg/(m^2). Pt meets criteria for overweight based on current BMI.  Estimated Nutritional Needs: Kcal: 25-30 kcal/kg Protein: > 1 gram protein/kg Fluid: 1 ml/kcal  Diet Order: General Pt is also offered choice of unit snacks mid-morning and mid-afternoon.  Pt is eating as desired.   Lab results and medications reviewed.   Antonieta Iba, RD, LDN Clinical Inpatient Dietitian Pager:  [phone] Weekend and after hours pager:  [phone]

## 2014-01-11 DIAGNOSIS — F319 Bipolar disorder, unspecified: Secondary | ICD-10-CM

## 2014-01-11 DIAGNOSIS — F411 Generalized anxiety disorder: Secondary | ICD-10-CM

## 2014-01-11 MED ORDER — PAROXETINE HCL ER 12.5 MG PO TB24: 12.5000 mg | ORAL_TABLET | Freq: Every day | ORAL | Status: DC

## 2014-01-11 MED ORDER — LAMOTRIGINE 100 MG PO TABS: 100.0000 mg | ORAL_TABLET | Freq: Every morning | ORAL | Status: DC

## 2014-01-11 MED ORDER — ESTRADIOL 0.5 MG PO TABS: 0.5000 mg | ORAL_TABLET | Freq: Every day | ORAL | Status: DC

## 2014-01-11 MED ORDER — QUETIAPINE FUMARATE 200 MG PO TABS: 200.0000 mg | ORAL_TABLET | Freq: Every day | ORAL | Status: DC

## 2014-01-11 MED ORDER — CENTRUM SILVER ULTRA WOMENS PO TABS: 1.0000 | ORAL_TABLET | Freq: Every day | ORAL | Status: DC

## 2014-01-11 MED ORDER — PRAVASTATIN SODIUM 40 MG PO TABS: 40.0000 mg | ORAL_TABLET | Freq: Every evening | ORAL | Status: DC

## 2014-01-11 NOTE — [facility] Suicide Risk Assessment (Signed)
   Demographic Factors:  Low socioeconomic status and lives with her husband  Total Time spent with patient: 20 minutes  Psychiatric Specialty Exam: Physical Exam  Psychiatric: She has a normal mood and affect. Her speech is normal and behavior is normal. Judgment and thought content normal. Cognition and memory are normal.    Review of Systems  Constitutional: Negative.   HENT: Negative.   Eyes: Negative.   Respiratory: Negative.   Cardiovascular: Negative.   Gastrointestinal: Negative.   Genitourinary: Negative.   Musculoskeletal: Negative.   Skin: Negative.   Neurological: Negative.   Endo/Heme/Allergies: Negative.   Psychiatric/Behavioral: Negative.     Blood pressure 125/76, pulse 101, temperature 98.1 F (36.7 C), temperature source Oral, resp. rate 20, height 5' 2.25" (1.581 m), weight 71.782 kg (158 lb 4 oz), last menstrual period 11/16/2009.Body mass index is 28.72 kg/(m^2).  General Appearance: Fairly Groomed  Engineer, water::  Good  Speech:  Clear and Coherent and Normal Rate  Volume:  Normal  Mood:  Euthymic  Affect:  Appropriate  Thought Process:  Goal Directed  Orientation:  Full (Time, Place, and Person)  Thought Content:  Negative  Suicidal Thoughts:  No  Homicidal Thoughts:  No  Memory:  Immediate;   Fair Recent;   Fair Remote;   Fair  Judgement:  Fair  Insight:  Fair  Psychomotor Activity:  Normal  Concentration:  Fair  Recall:  [employer] of Knowledge:Good  Language: Fair  Akathisia:  No  Handed:  Right  AIMS (if indicated):     Assets:  Communication Skills Desire for Improvement Physical Health  Sleep:  Number of Hours: 6.25    Musculoskeletal: Strength & Muscle Tone: within normal limits Gait & Station: normal Patient leans: N/A   Mental Status Per Nursing Assessment::   On Admission:   (none)  Current Mental Status by Physician: patient denies suicidal ideation, intent or plan  Loss Factors: NA  Historical Factors: NA  Risk  Reduction Factors:   Living with another person, especially a relative and Positive social support  Continued Clinical Symptoms:  Resolving mood disorder  Cognitive Features That Contribute To Risk:  Closed-mindedness    Suicide Risk:  Minimal: No identifiable suicidal ideation.  Patients presenting with no risk factors but with morbid ruminations; may be classified as minimal risk based on the severity of the depressive symptoms  Discharge Diagnoses:   AXIS I:  Bipolar disorder, unspecified              Anxiety disorder, unspecified  AXIS II:  Deferred AXIS III:   Past Medical History  Diagnosis Date  . Smoker   . Glaucoma     stage 2  . Hyperlipidemia   . Allergy     seasonal       AXIS IV:  other psychosocial or environmental problems and problems related to social environment AXIS V:  61-70 mild symptoms  Plan Of Care/Follow-up recommendations:  Activity:  as tolerated Diet:  healthy Tests:  routine Other:  patient to keep her after care appointment   Is patient on multiple antipsychotic therapies at discharge:  No   Has Patient had three or more failed trials of antipsychotic monotherapy by history:  No  Recommended Plan for Multiple Antipsychotic Therapies: NA    Corena Pilgrim, MD 01/11/2014, 10:36 AM

## 2014-01-11 NOTE — Discharge Summary (Signed)
Physician Discharge Summary Note  Patient:  Natalie [name] is an 53 y.o., female MRN:  [id number] DOB:  1961-01-13 Patient phone:  [phone] (home)  Patient address:   [place] [address],  Total Time spent with patient: 20 minutes  Date of Admission:  01/08/2014 Date of Discharge: 01/11/14  Reason for Admission:  Acute Mania   Discharge Diagnoses: Principal Problem:   Bipolar disorder, unspecified Active Problems:   Anxiety   Medication management   Bipolar disorder with severe mania   Psychiatric Specialty Exam: Physical Exam  Psychiatric: She has a normal mood and affect. Her speech is normal and behavior is normal. Judgment and thought content normal. Cognition and memory are normal.    Review of Systems  Constitutional: Negative.   HENT: Negative.   Eyes: Negative.   Respiratory: Negative.   Cardiovascular: Negative.   Gastrointestinal: Negative.   Genitourinary: Negative.   Musculoskeletal: Negative.   Skin: Negative.   Neurological: Negative.   Endo/Heme/Allergies: Negative.   Psychiatric/Behavioral: Negative.     Blood pressure 125/76, pulse 101, temperature 98.1 F (36.7 C), temperature source Oral, resp. rate 20, height 5' 2.25" (1.581 m), weight 71.782 kg (158 lb 4 oz), last menstrual period 11/16/2009.Body mass index is 28.72 kg/(m^2).  General Appearance: Fairly Groomed  Engineer, water::  Good  Speech:  Clear and Coherent and Normal Rate  Volume:  Normal  Mood:  Euthymic  Affect:  Appropriate  Thought Process:  Goal Directed  Orientation:  Full (Time, Place, and Person)  Thought Content:  Negative  Suicidal Thoughts:  No  Homicidal Thoughts:  No  Memory:  Immediate;   Fair Recent;   Fair Remote;   Fair  Judgement:  Fair  Insight:  Fair  Psychomotor Activity:  Normal  Concentration:  Fair  Recall:  [employer] of Knowledge:Good  Language: Fair  Akathisia:  No  Handed:  Right  AIMS (if indicated):     Assets:  Communication  Skills Desire for Improvement Physical Health  Sleep:  Number of Hours: 6.25    Past Psychiatric History: See H&P Diagnosis:  Hospitalizations:  Outpatient Care:  Substance Abuse Care:  Self-Mutilation:  Suicidal Attempts:  Violent Behaviors:   Musculoskeletal: Strength & Muscle Tone: within normal limits Gait & Station: normal Patient leans: N/A  DSM5: AXIS I: Bipolar disorder, unspecified  Anxiety disorder, unspecified  AXIS II: Deferred  AXIS III:  Past Medical History   Diagnosis  Date   .  Smoker    .  Glaucoma      stage 2   .  Hyperlipidemia    .  Allergy      seasonal       AXIS IV: other psychosocial or environmental problems and problems related to social environment  AXIS V: 61-70 mild symptoms  Level of Care:  OP  Hospital Course:  Natalie [name] is a 53 year old female who was brought to the [facility] by the police due to manic behaviors after stopping her psychiatric medications. The patient had been concerned about weight gain on Seroquel and had recently switched to [place]. She became very combative with the police and used threatening language.           Natalie [name] was admitted to the adult 400 unit where she was evaluated and her symptoms were identified. Medication management was discussed and implemented. The patient was restarted on Seroquel at 200 mg hs for mood control, Lamictal 100 mg for mood control,  and Paxil CR 12.5 mg daily for anxiety. She was encouraged to participate in unit programming. Medical problems were identified and treated appropriately. Home medication was restarted as needed. She was evaluated each day by a clinical provider to ascertain the patient's response to treatment.  Improvement was noted by the patient's report of decreasing symptoms, improved sleep and appetite, affect, medication tolerance, behavior, and participation in unit programming.  The patient was asked each day to complete a self inventory noting mood, mental status,  pain, new symptoms, anxiety and concerns.         She responded well to medication and being in a therapeutic and supportive environment. Positive and appropriate behavior was noted and the patient was motivated for recovery.  She worked closely with the treatment team and case manager to develop a discharge plan with appropriate goals. Coping skills, problem solving as well as relaxation therapies were also part of the unit programming.         By the day of discharge she was in much improved condition than upon admission.  Symptoms were reported as significantly decreased or resolved completely. The patient denied SI/HI and voiced no AVH. She was motivated to continue taking medication with a goal of continued improvement in mental health. Natalie [name] was discharged home with a plan to follow up as noted below.  Consults:  psychiatry  Significant Diagnostic Studies:  Chem profile, CBC, UDS positive for marijuana   Discharge Vitals:   Blood pressure 125/76, pulse 101, temperature 98.1 F (36.7 C), temperature source Oral, resp. rate 20, height 5' 2.25" (1.581 m), weight 71.782 kg (158 lb 4 oz), last menstrual period 11/16/2009. Body mass index is 28.72 kg/(m^2). Lab Results:   Results for orders placed during the hospital encounter of 01/08/14 (from the past 72 hour(s))  BASIC METABOLIC PANEL     Status: Abnormal   Collection Time    01/10/14  7:36 PM      Result Value Ref Range   Sodium 138  137 - 147 mEq/L   Potassium 3.6 (*) 3.7 - 5.3 mEq/L   Chloride 97  96 - 112 mEq/L   CO2 28  19 - 32 mEq/L   Glucose, Bld 101 (*) 70 - 99 mg/dL   BUN 8  6 - 23 mg/dL   Creatinine, Ser 0.82  0.50 - 1.10 mg/dL   Calcium 9.7  8.4 - 10.5 mg/dL   GFR calc non Af Amer 79 (*) >90 mL/min   GFR calc Af Amer >90  >90 mL/min   Comment: (NOTE)     The eGFR has been calculated using the CKD EPI equation.     This calculation has not been validated in all clinical situations.     eGFR's persistently <90 mL/min  signify possible Chronic Kidney     Disease.     Performed at [facility]    Physical Findings: AIMS: Facial and Oral Movements Muscles of Facial Expression: None, normal Lips and Perioral Area: None, normal Jaw: None, normal Tongue: None, normal,Extremity Movements Upper (arms, wrists, hands, fingers): None, normal Lower (legs, knees, ankles, toes): None, normal, Trunk Movements Neck, shoulders, hips: None, normal, Overall Severity Severity of abnormal movements (highest score from questions above): None, normal Incapacitation due to abnormal movements: None, normal Patient's awareness of abnormal movements (rate only patient's report): No Awareness, Dental Status Current problems with teeth and/or dentures?: Yes Does patient usually wear dentures?: No  CIWA:    COWS:  Psychiatric Specialty Exam: See Psychiatric Specialty Exam and Suicide Risk Assessment completed by Attending Physician prior to discharge.  Discharge destination:  Home  Is patient on multiple antipsychotic therapies at discharge:  No   Has Patient had three or more failed trials of antipsychotic monotherapy by history:  No  Recommended Plan for Multiple Antipsychotic Therapies: NA     Medication List    STOP taking these medications       LATUDA 20 MG Tabs  Generic drug:  Lurasidone HCl     PARoxetine 20 MG tablet  Commonly known as:  PAXIL  Replaced by:  PARoxetine 12.5 MG 24 hr tablet      TAKE these medications     Indication   CENTRUM SILVER ULTRA WOMENS Tabs  Take 1 tablet by mouth daily.   Indication:  Vitamin Supplementation     diphenhydrAMINE 25 mg capsule  Commonly known as:  BENADRYL  Take 25 mg by mouth every 6 (six) hours as needed. For allergies      estradiol 0.5 MG tablet  Commonly known as:  ESTRACE  Take 1 tablet (0.5 mg total) by mouth daily.   Indication:  Deficiency of the Hormone Estrogen     lamoTRIgine 100 MG tablet  Commonly known as:   LAMICTAL  Take 1 tablet (100 mg total) by mouth every morning.   Indication:  Depressive Phase of Manic-Depression     PARoxetine 12.5 MG 24 hr tablet  Commonly known as:  PAXIL-CR  Take 1 tablet (12.5 mg total) by mouth daily.   Indication:  Panic Disorder     pravastatin 40 MG tablet  Commonly known as:  PRAVACHOL  Take 1 tablet (40 mg total) by mouth every evening.   Indication:  Disease involving Cholesterol Deposits in the Arteries     QUEtiapine 200 MG tablet  Commonly known as:  SEROQUEL  Take 1 tablet (200 mg total) by mouth at bedtime.   Indication:  Manic Phase of Manic-Depression     traMADol 50 MG tablet  Commonly known as:  ULTRAM  Take 50 mg by mouth every 6 (six) hours as needed for moderate pain.          Follow-up recommendations:   Activity: as tolerated  Diet: healthy  Tests: routine  Other: patient to keep her after care appointment   Comments:   Take all your medications as prescribed by your mental healthcare provider.  Report any adverse effects and or reactions from your medicines to your outpatient provider promptly.  Patient is instructed and cautioned to not engage in alcohol and or illegal drug use while on prescription medicines.  In the event of worsening symptoms, patient is instructed to call the crisis hotline, 911 and or go to the nearest ED for appropriate evaluation and treatment of symptoms.  Follow-up with your primary care provider for your other medical issues, concerns and or health care needs.   Total Discharge Time:  Greater than 30 minutes.  SignedElmarie Shiley NP-C 01/11/2014, 10:30 AM  Patient seen, evaluated and I agree with notes by Nurse Practitioner. Corena Pilgrim, MD

## 2014-01-11 NOTE — [facility] Suicide Risk Assessment (Signed)
[place] INPATIENT:  Family/Significant Other Suicide Prevention Education  Suicide Prevention Education:  Education Completed; Natalie [name], husband, [phone] has been identified by the patient as the family member/significant other with whom the patient will be residing, and identified as the person(s) who will aid the patient in the event of a mental health crisis (suicidal ideations/suicide attempt).  With written consent from the patient, the family member/significant other has been provided the following suicide prevention education, prior to the and/or following the discharge of the patient.  The suicide prevention education provided includes the following:  Suicide risk factors  Suicide prevention and interventions  National Suicide Hotline telephone number  [facility] assessment telephone number  [facility] Emergency Assistance [place] and/or Residential Mobile Crisis Unit telephone number  Request made of family/significant other to:  Remove weapons (e.g., guns, rifles, knives), all items previously/currently identified as safety concern.    Remove drugs/medications (over-the-counter, prescriptions, illicit drugs), all items previously/currently identified as a safety concern.  The family member/significant other verbalizes understanding of the suicide prevention education information provided.  The family member/significant other agrees to remove the items of safety concern listed above.  Natalie [name] 01/11/2014, 11:25 AM

## 2014-01-11 NOTE — Tx Team (Signed)
  Interdisciplinary Treatment Plan Update   Date Reviewed:  01/11/2014  Time Reviewed:  11:41 AM  Progress in Treatment:   Attending groups: Yes Participating in groups: Yes Taking medication as prescribed: Yes  Tolerating medication: Yes Family/Significant other contact made: Yes  Patient understands diagnosis: Yes  Discussing patient identified problems/goals with staff: Yes Medical problems stabilized or resolved: Yes Denies suicidal/homicidal ideation: Yes Patient has not harmed self or others: Yes  For review of initial/current patient goals, please see plan of care.  Estimated Length of Stay:  D/C today  Reason for Continuation of Hospitalization:   New Problems/Goals identified:  N/A  Discharge Plan or Barriers:   return home, follow up outpt  Additional Comments:  Attendees:  Signature: Corena Pilgrim, MD 01/11/2014 11:41 AM   Signature: Ripley Fraise, LCSW 01/11/2014 11:41 AM  Signature: Elmarie Shiley, NP 01/11/2014 11:41 AM  Signature: Mayra Neer, RN 01/11/2014 11:41 AM  Signature: Darrol Angel, RN 01/11/2014 11:41 AM  Signature:  01/11/2014 11:41 AM  Signature:   01/11/2014 11:41 AM  Signature:    Signature:    Signature:    Signature:    Signature:    Signature:      Scribe for Treatment Team:   Ripley Fraise, LCSW  01/11/2014 11:41 AM

## 2014-01-11 NOTE — Progress Notes (Signed)
[facility] Adult Case Management Discharge Plan :  Will you be returning to the same living situation after discharge: Yes,  home At discharge, do you have transportation home?:Yes,  husband Do you have the ability to pay for your medications:Yes,  MCD  Release of information consent forms completed and in the chart;  Patient's signature needed at discharge.  Patient to Follow up at: Follow-up Information   Follow up with [facility]. (Go to the walk in clinic M-F between 8 and 9AM for your hospital follow up appointment, or call them to find out if you can just come to your previously scheduled appointment.  Just make sure they know you were in the hospital)    Contact information:   [place] [phone]      Patient denies SI/HI:   Yes,  yes    Safety Planning and Suicide Prevention discussed:  Yes,  yes  Trish Mage 01/11/2014, 11:44 AM

## 2014-01-11 NOTE — Progress Notes (Signed)
Discharge note: pt received both written and verbal discharge instructions. Pt agreed to f/u appt and med regimen. Pt verbalized understanding of d/c instructions. Pt received belongings from room and locker. Pt given sample meds and prescriptions. Pt safely left [facility] with her husband Natalie [name]. Pt denies SI/HI/AVH at time of d/c.

## 2014-01-16 NOTE — Progress Notes (Signed)
Patient Discharge Instructions:  After Visit Summary (AVS):   Faxed to:  01/16/14 Discharge Summary Note:   Faxed to:  01/16/14 Psychiatric Admission Assessment Note:   Faxed to:  01/16/14 Suicide Risk Assessment - Discharge Assessment:   Faxed to:  01/16/14 Faxed/Sent to the Next Level Care provider:  01/16/14 Faxed to  [facility] [place], 01/16/2014, 2:00 PM

## 2014-03-15 ENCOUNTER — Emergency Department (HOSPITAL_COMMUNITY)
Admission: EM | Admit: 2014-03-15 | Discharge: 2014-03-16 | Disposition: A | Discharge status: Home / Self Care | Payer: Medicaid Other | Attending: Emergency Medicine | Admitting: Emergency Medicine

## 2014-03-15 ENCOUNTER — Encounter (HOSPITAL_COMMUNITY): Payer: Self-pay | Admitting: Emergency Medicine

## 2014-03-15 DIAGNOSIS — Z008 Encounter for other general examination: Secondary | ICD-10-CM | POA: Insufficient documentation

## 2014-03-15 DIAGNOSIS — F3289 Other specified depressive episodes: Secondary | ICD-10-CM | POA: Insufficient documentation

## 2014-03-15 DIAGNOSIS — F17200 Nicotine dependence, unspecified, uncomplicated: Secondary | ICD-10-CM | POA: Diagnosis not present

## 2014-03-15 DIAGNOSIS — F411 Generalized anxiety disorder: Secondary | ICD-10-CM | POA: Insufficient documentation

## 2014-03-15 DIAGNOSIS — F329 Major depressive disorder, single episode, unspecified: Secondary | ICD-10-CM | POA: Diagnosis not present

## 2014-03-15 DIAGNOSIS — F419 Anxiety disorder, unspecified: Secondary | ICD-10-CM

## 2014-03-15 DIAGNOSIS — F319 Bipolar disorder, unspecified: Secondary | ICD-10-CM | POA: Diagnosis not present

## 2014-03-15 DIAGNOSIS — Z8669 Personal history of other diseases of the nervous system and sense organs: Secondary | ICD-10-CM | POA: Insufficient documentation

## 2014-03-15 DIAGNOSIS — E785 Hyperlipidemia, unspecified: Secondary | ICD-10-CM | POA: Diagnosis not present

## 2014-03-15 LAB — COMPREHENSIVE METABOLIC PANEL
ALT: 17 U/L (ref 0–35)
AST: 20 U/L (ref 0–37)
Albumin: 4.9 g/dL (ref 3.5–5.2)
Alkaline Phosphatase: 86 U/L (ref 39–117)
Anion gap: 12 (ref 5–15)
BUN: 7 mg/dL (ref 6–23)
CO2: 27 mEq/L (ref 19–32)
Calcium: 9.7 mg/dL (ref 8.4–10.5)
Chloride: 97 mEq/L (ref 96–112)
Creatinine, Ser: 0.91 mg/dL (ref 0.50–1.10)
GFR calc Af Amer: 81 mL/min — ABNORMAL LOW (ref >90)
GFR calc non Af Amer: 70 mL/min — ABNORMAL LOW (ref >90)
Glucose, Bld: 94 mg/dL (ref 70–99)
Potassium: 3.8 mEq/L (ref 3.7–5.3)
Sodium: 136 mEq/L — ABNORMAL LOW (ref 137–147)
Total Bilirubin: 0.5 mg/dL (ref 0.3–1.2)
Total Protein: 8.2 g/dL (ref 6.0–8.3)

## 2014-03-15 LAB — CBC
HCT: 40.8 % (ref 36.0–46.0)
Hemoglobin: 13.7 g/dL (ref 12.0–15.0)
MCH: 31.5 pg (ref 26.0–34.0)
MCHC: 33.6 g/dL (ref 30.0–36.0)
MCV: 93.8 fL (ref 78.0–100.0)
Platelets: 205 10*3/uL (ref 150–400)
RBC: 4.35 MIL/uL (ref 3.87–5.11)
RDW: 12.6 % (ref 11.5–15.5)
WBC: 7 10*3/uL (ref 4.0–10.5)

## 2014-03-15 LAB — ACETAMINOPHEN LEVEL: Acetaminophen (Tylenol), Serum: <15 ug/mL (ref 10–30)

## 2014-03-15 LAB — SALICYLATE LEVEL: Salicylate Lvl: <2 mg/dL — ABNORMAL LOW (ref 2.8–20.0)

## 2014-03-15 LAB — ETHANOL: Alcohol, Ethyl (B): <11 mg/dL (ref 0–11)

## 2014-03-15 MED ORDER — ZOLPIDEM TARTRATE 5 MG PO TABS: 5.0000 mg | ORAL_TABLET | Freq: Every evening | ORAL | Status: DC | PRN

## 2014-03-15 MED ORDER — LORAZEPAM 1 MG PO TABS: 1.0000 mg | ORAL_TABLET | Freq: Three times a day (TID) | ORAL | Status: DC | PRN

## 2014-03-15 MED ORDER — ONDANSETRON HCL 4 MG PO TABS: 4.0000 mg | ORAL_TABLET | Freq: Three times a day (TID) | ORAL | Status: DC | PRN

## 2014-03-15 NOTE — ED Notes (Signed)
Patient refused to sign consent to release information to her husband. Husband informed by V. Rufus. MHT that we are unable to provide any information at this time due to HIPPA laws.

## 2014-03-15 NOTE — ED Provider Notes (Signed)
CSN: [id number]     Arrival date & time 03/15/14  2010 History   First MD Initiated Contact with Patient 03/15/14 2135     Chief Complaint  Patient presents with  . Medical Clearance    IVC     (Consider location/radiation/quality/duration/timing/severity/associated sxs/prior Treatment) HPI Pt with hx of bipolar disorder presents under IVC paperwork initiated by her husband.  Per patient she had been arguing with her husband and went to her aunt's house to watch TV. She went for walk with aunt and was surprised to find police at aunt's home who were there to place her under IVC.  She states she has not been feeling SI/HI, she denies any hallucinations.  Endorses taking her medications properly  She is accompanied by family members who state she is at her baseline and they have no knowledge of her acting erratically.  They state they have seen concerning behaviors in her in the past when she is not taking meds but currently they state she has no active issues with her bipolar disorder.  Pt agrees with this.  She and family state that the husband threatens to 'call the sherriff" any time they get into an argument.  She denies any recent illness.  No fever/chills, no vomiting or diarrhea.  No chest or abdominal pain.  There are no other associated systemic symptoms, there are no other alleviating or modifying factors.  Family states if she is discharged she will be able to go home with them and will be in a safe environment away from the husband.    Past Medical History  Diagnosis Date  . Bipolar 1 disorder   . Smoker   . Glaucoma     stage 2  . Hyperlipidemia   . Allergy     seasonal  . Anxiety     bipolar  . Depression    Past Surgical History  Procedure Laterality Date  . Tubal ligation  1993  . Cosmetic surgery  1967    face after accident   Family History  Problem Relation Age of Onset  . Colon cancer Maternal Grandfather   . Hypertension Father   . Hyperlipidemia Father   .  Cancer Neg Hx   . Heart disease Neg Hx    History  Substance Use Topics  . Smoking status: Current Every Day Smoker -- 0.25 packs/day for 15 years    Types: Cigarettes  . Smokeless tobacco: Current User    Types: Snuff  . Alcohol Use: Not on file   OB History   Grav Para Term Preterm Abortions TAB SAB Ect Mult Living                 Review of Systems ROS reviewed and all otherwise negative except for mentioned in HPI    Allergies  Sulfa antibiotics; Aspirin; Codeine; Hydrocodone; Mobic; and Tylenol  Home Medications   Prior to Admission medications   Medication Sig Start Date End Date Taking? Authorizing Provider  diphenhydrAMINE (BENADRYL) 25 mg capsule Take 25 mg by mouth every 6 (six) hours as needed. For allergies   Yes Historical Provider, MD  lamoTRIgine (LAMICTAL) 100 MG tablet Take 1 tablet (100 mg total) by mouth every morning. 03/16/14   Benjamine Mola, FNP  PARoxetine (PAXIL) 20 MG tablet Take 1 tablet (20 mg total) by mouth daily. 03/16/14   Benjamine Mola, FNP  pravastatin (PRAVACHOL) 40 MG tablet Take 1 tablet (40 mg total) by mouth every evening. 03/16/14  Benjamine Mola, FNP  QUEtiapine (SEROQUEL) 100 MG tablet Take 1 tablet (100 mg total) by mouth at bedtime. 03/16/14   Elyse Jarvis Withrow, FNP   BP 118/56  Pulse 65  Temp(Src) 97.9 F (36.6 C) (Oral)  Resp 12  SpO2 98%  LMP 11/16/2009 Vitals reviewed Physical Exam Physical Examination: General appearance - alert, well appearing, and in no distress Mental status - alert, oriented to person, place, and time Eyes -no conjunctival injection, no scleral icterus Chest - clear to auscultation, no wheezes, rales or rhonchi, symmetric air entry Heart - normal rate, regular rhythm, normal S1, S2, no murmurs, rubs, clicks or gallops Extremities - peripheral pulses normal, no pedal edema, no clubbing or cyanosis Skin - normal coloration and turgor, no rashes, no suspicious skin lesions noted Psych- calm cooperative, no  tangential thoughts, rational thought process  ED Course  Procedures (including critical care time)  10:28 PM I have talked with TTS, to see if they can expedite her evaluation.  Pt is calm, cooperative, she has family members at bedside who state that this is her baseline, that they have not noted any difference in her behavior.  They state she has had problems when she is not taking meds, but they feel that she is at her baseline now.  They are asserting that husband seems to threaten IVC and, in this case, pursue IVC when he is angry with her.  TTS will see if PA can do a telepsych assessment.  Labs Review Labs Reviewed  COMPREHENSIVE METABOLIC PANEL - Abnormal; Notable for the following:    Sodium 136 (*)    GFR calc non Af Amer 70 (*)    GFR calc Af Amer 81 (*)    All other components within normal limits  SALICYLATE LEVEL - Abnormal; Notable for the following:    Salicylate Lvl <4.1 (*)    All other components within normal limits  ACETAMINOPHEN LEVEL  CBC  ETHANOL    Imaging Review No results found.   EKG Interpretation None      MDM   Final diagnoses:  Anxiety  Bipolar disorder, unspecified    Pt presenting under IVC paperwork with Sherriff.  She seems rational at this time and has no signs of acute pychosis, no suicidal or homicidal ideations.  I have talked with TTS and they will evaluate patient.  Pt is calm and cooperative and agreeable with plan.      Threasa Beards, MD 03/18/14 [phone]

## 2014-03-15 NOTE — ED Notes (Signed)
Consult in progress.

## 2014-03-15 NOTE — ED Notes (Signed)
Pt brought in by [place] with IVC papers taken out by her husband.   Reports pt is combative and aggressive, has hx of bipolar.  IT consultant reported that pt is calm and cooperative at this time and has not had problems with the pt.

## 2014-03-16 DIAGNOSIS — F411 Generalized anxiety disorder: Secondary | ICD-10-CM

## 2014-03-16 MED ORDER — PRAVASTATIN SODIUM 40 MG PO TABS: 40.0000 mg | ORAL_TABLET | Freq: Every evening | ORAL | Status: DC

## 2014-03-16 MED ORDER — LAMOTRIGINE 100 MG PO TABS
100.0000 mg | ORAL_TABLET | Freq: Every morning | ORAL | Status: DC
  Administered 2014-03-16: 100 mg via ORAL
  Filled 2014-03-16: qty 1

## 2014-03-16 MED ORDER — QUETIAPINE FUMARATE ER 50 MG PO TB24
100.0000 mg | ORAL_TABLET | Freq: Every day | ORAL | Status: DC
  Administered 2014-03-16: 100 mg via ORAL
  Filled 2014-03-16 (×2): qty 2

## 2014-03-16 MED ORDER — PAROXETINE HCL 20 MG PO TABS: 20.0000 mg | ORAL_TABLET | Freq: Every day | ORAL | Status: AC

## 2014-03-16 MED ORDER — QUETIAPINE FUMARATE 100 MG PO TABS: 100.0000 mg | ORAL_TABLET | Freq: Every day | ORAL | Status: DC

## 2014-03-16 MED ORDER — LAMOTRIGINE 100 MG PO TABS: 100.0000 mg | ORAL_TABLET | Freq: Every morning | ORAL | Status: AC

## 2014-03-16 MED ORDER — PAROXETINE HCL 20 MG PO TABS
20.0000 mg | ORAL_TABLET | Freq: Every morning | ORAL | Status: DC
  Administered 2014-03-16: 20 mg via ORAL
  Filled 2014-03-16: qty 1

## 2014-03-16 NOTE — ED Notes (Signed)
[place] Cornelia mother this is her number([phone]) will pick patient up if discharge.
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
## 2014-03-16 NOTE — ED Notes (Signed)
Dr Lovena Le and conrad into see

## 2014-03-16 NOTE — ED Notes (Signed)
Eating breakfast,  Talking w/ her mom
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
## 2014-03-16 NOTE — BH Assessment (Deleted)
Discharge home per Heloise Purpura, NP and Dr. Lovena Le. Writer provided patient with a list of follow up outpatient referrals including [facility] and [facility]. IVC rescinded.

## 2014-03-16 NOTE — BH Assessment (Signed)
[place] Assessment Progress Note   Natalie Purpura, NP and Dr. Lovena Le evaluated patient and recommend discharge home. Patient has a  follow up appointment with Dr. Glennon Mac at [facility]. Patient's appointment is scheduled for Monday at 10:30am as scheduled.

## 2014-03-16 NOTE — ED Notes (Signed)
Up to the bathroom 

## 2014-03-16 NOTE — ED Notes (Addendum)
Written dc instruction reviewed w/ pt.  Pt encouraged to keep her appt Monday as scheduled, take her medications as directed and follow up w/her PMD and  to return/seek treatment for suicidal/homicidal thoughts or urges.  Pt verbalized understanding of instructions.  Pt ambulatory to dc window w/ mHt, belongings returned after leaving the unit.

## 2014-03-16 NOTE — Consult Note (Signed)
Face to face evaluation and I agree with this note 

## 2014-03-16 NOTE — ED Provider Notes (Signed)
  Physical Exam  BP 132/69  Pulse 58  Temp(Src) 98.3 F (36.8 C) (Oral)  Resp 19  SpO2 100%  LMP 11/16/2009  Physical Exam  ED Course  Procedures  MDM  Pt seen by Dr. Canary Brim. Pt was IVC by husband, for manic behavior. No SI, HI, and Dr. Canary Brim assessed the patient and finds her calm, cooperative and with good mentation.  Dr. Canary Brim has requested TTS to see her, and if they find her clear to go, she can be safely discharged. For this reason, and due to her very normal psych evaluation, she has not done IVC paperwork.  Varney Biles, MD 03/16/14 [phone]

## 2014-10-11 ENCOUNTER — Ambulatory Visit: Payer: Medicaid Other | Admitting: Physician Assistant

## 2014-10-19 ENCOUNTER — Encounter: Payer: Self-pay | Admitting: Physician Assistant

## 2014-10-19 ENCOUNTER — Ambulatory Visit (INDEPENDENT_AMBULATORY_CARE_PROVIDER_SITE_OTHER): Payer: Medicaid Other | Admitting: Physician Assistant

## 2014-10-19 VITALS — BP 120/74 | HR 76 | Temp 98.0°F | Resp 14 | Ht 62.5 in | Wt 153.0 lb

## 2014-10-19 DIAGNOSIS — J019 Acute sinusitis, unspecified: Secondary | ICD-10-CM

## 2014-10-19 MED ORDER — PREDNISONE 20 MG PO TABS: ORAL_TABLET | ORAL | Status: DC

## 2014-10-19 MED ORDER — PRAVASTATIN SODIUM 40 MG PO TABS: 40.0000 mg | ORAL_TABLET | Freq: Every evening | ORAL | Status: DC

## 2014-10-19 NOTE — Progress Notes (Signed)
Patient ID: Natalie [name] MRN: [id number], DOB: 1961-02-16, 54 y.o. Date of Encounter: 10/19/2014, 10:11 AM    Chief Complaint:  Chief Complaint  Patient presents with  . Illness    nasal congestion, sinus pressure around eyes and head     HPI: 54 y.o. year old white female reports that she has been having these symptoms for 1 week now. Points to both of her temples and says that that area was pounding on Tuesday to where she couldn't even get out of bed. She has also had a lot of pressure behind and around both eyes. Also a lot of pressure and congestion behind her right ear.Says it is tender to the touch at temple area bilaterally.  Says she has not been getting any mucus or clear drainage from her nose and has had no cough. No fevers or chills. No sore throat.  Also is requesting refill on her pravastatin.  Home Meds:   Outpatient Prescriptions Prior to Visit  Medication Sig Dispense Refill  . diphenhydrAMINE (BENADRYL) 25 mg capsule Take 25 mg by mouth every 6 (six) hours as needed. For allergies    . lamoTRIgine (LAMICTAL) 100 MG tablet Take 1 tablet (100 mg total) by mouth every morning. 30 tablet 0  . PARoxetine (PAXIL) 20 MG tablet Take 1 tablet (20 mg total) by mouth daily.    . QUEtiapine (SEROQUEL) 100 MG tablet Take 1 tablet (100 mg total) by mouth at bedtime.    . pravastatin (PRAVACHOL) 40 MG tablet Take 1 tablet (40 mg total) by mouth every evening. 30 tablet    No facility-administered medications prior to visit.    Allergies:  Allergies  Allergen Reactions  . Sulfa Antibiotics Anaphylaxis and Swelling     flu like sx  . Aspirin Other (See Comments)    Has ulcers  . Codeine Nausea And Vomiting and Other (See Comments)    Stomach pain  . Hydrocodone Nausea And Vomiting  . Mobic [Meloxicam] Nausea And Vomiting  . Tylenol [Acetaminophen] Other (See Comments)    Has ulcers      Review of Systems: See HPI for pertinent ROS. All other ROS negative.     Physical Exam: Blood pressure 120/74, pulse 76, temperature 98 F (36.7 C), temperature source Oral, resp. rate 14, height 5' 2.5" (1.588 m), weight 153 lb (69.4 kg), last menstrual period 11/16/2009., Body mass index is 27.52 kg/(m^2). General:  WNWD WF. Appears in no acute distress. HEENT: Normocephalic, atraumatic, eyes without discharge, sclera non-icteric, nares are without discharge. Bilateral auditory canals clear, TM's are without perforation, pearly grey and translucent with reflective cone of light bilaterally. There is some dullness on the bottom half of the right TM.  Oral cavity moist, posterior pharynx without exudate, erythema, peritonsillar abscess. Mild tenderness with percussion of bilateral maxillary sinuses and frontal sinuses. Neck: Supple. No thyromegaly. No lymphadenopathy. Lungs: Clear bilaterally to auscultation without wheezes, rales, or rhonchi. Breathing is unlabored. Heart: Regular rhythm. No murmurs, rubs, or gallops. Msk:  Strength and tone normal for age. Extremities/Skin: Warm and dry.  Neuro: Alert and oriented X 3. Moves all extremities spontaneously. Gait is normal. CNII-XII grossly in tact. Psych:  Responds to questions appropriately with a normal affect.     ASSESSMENT AND PLAN:  54 y.o. year old female with  1. Acute sinusitis, recurrence not specified, unspecified location - predniSONE (DELTASONE) 20 MG tablet; Take 3 daily for 2 days, then 2 daily for 2 days, then 1 daily for  2 days.  Dispense: 12 tablet; Refill: 0  Take prednisone taper as directed. Follow-up if symptoms do not resolve with completion of this. Cautioned patient that this may cause some jitteriness and insomnia. Told her to make sure to take it in the morning and not at nighttime.  She is requesting refill on her pravastatin. I reviewed that her last lipid panel was 11/2013. She states that she will schedule a routine office visit and will come fasting for that appointment. I  sent  prescription for #30+0 to hold her over until the appointment.  Signed, [id number] [place], [place], [facility] 10/19/2014 10:11 AM

## 2015-09-07 ENCOUNTER — Other Ambulatory Visit: Payer: Medicaid Other

## 2015-09-07 DIAGNOSIS — F17200 Nicotine dependence, unspecified, uncomplicated: Secondary | ICD-10-CM

## 2015-09-07 DIAGNOSIS — F32A Depression, unspecified: Secondary | ICD-10-CM

## 2015-09-07 DIAGNOSIS — F329 Major depressive disorder, single episode, unspecified: Secondary | ICD-10-CM

## 2015-09-07 DIAGNOSIS — E785 Hyperlipidemia, unspecified: Secondary | ICD-10-CM

## 2015-09-07 DIAGNOSIS — Z79899 Other long term (current) drug therapy: Secondary | ICD-10-CM

## 2015-09-07 DIAGNOSIS — E559 Vitamin D deficiency, unspecified: Secondary | ICD-10-CM

## 2015-09-07 LAB — CBC WITH DIFFERENTIAL/PLATELET
Basophils Absolute: 0 10*3/uL (ref 0.0–0.1)
Basophils Relative: 0 % (ref 0–1)
Eosinophils Absolute: 0.3 10*3/uL (ref 0.0–0.7)
Eosinophils Relative: 5 % (ref 0–5)
HCT: 37.7 % (ref 36.0–46.0)
Hemoglobin: 12.3 g/dL (ref 12.0–15.0)
Lymphocytes Relative: 42 % (ref 12–46)
Lymphs Abs: 2.7 10*3/uL (ref 0.7–4.0)
MCH: 30.9 pg (ref 26.0–34.0)
MCHC: 32.6 g/dL (ref 30.0–36.0)
MCV: 94.7 fL (ref 78.0–100.0)
MPV: 9.4 fL (ref 8.6–12.4)
Monocytes Absolute: 0.4 10*3/uL (ref 0.1–1.0)
Monocytes Relative: 7 % (ref 3–12)
Neutro Abs: 2.9 10*3/uL (ref 1.7–7.7)
Neutrophils Relative %: 46 % (ref 43–77)
Platelets: 219 10*3/uL (ref 150–400)
RBC: 3.98 MIL/uL (ref 3.87–5.11)
RDW: 14.8 % (ref 11.5–15.5)
WBC: 6.4 10*3/uL (ref 4.0–10.5)

## 2015-09-07 LAB — COMPLETE METABOLIC PANEL WITH GFR
ALT: 10 U/L (ref 6–29)
AST: 16 U/L (ref 10–35)
Albumin: 4.1 g/dL (ref 3.6–5.1)
Alkaline Phosphatase: 70 U/L (ref 33–130)
BUN: 12 mg/dL (ref 7–25)
CO2: 27 mmol/L (ref 20–31)
Calcium: 9 mg/dL (ref 8.6–10.4)
Chloride: 103 mmol/L (ref 98–110)
Creat: 0.97 mg/dL (ref 0.50–1.05)
GFR, Est African American: 75 mL/min (ref >=60)
GFR, Est Non African American: 65 mL/min (ref >=60)
Glucose, Bld: 90 mg/dL (ref 70–99)
Potassium: 4.4 mmol/L (ref 3.5–5.3)
Sodium: 140 mmol/L (ref 135–146)
Total Bilirubin: 0.3 mg/dL (ref 0.2–1.2)
Total Protein: 6.4 g/dL (ref 6.1–8.1)

## 2015-09-07 LAB — LIPID PANEL
Cholesterol: 246 mg/dL — ABNORMAL HIGH (ref 125–200)
HDL: 41 mg/dL — ABNORMAL LOW (ref >=46)
LDL Cholesterol: 154 mg/dL — ABNORMAL HIGH (ref <130)
Total CHOL/HDL Ratio: 6 Ratio — ABNORMAL HIGH (ref <=5.0)
Triglycerides: 256 mg/dL — ABNORMAL HIGH (ref <150)
VLDL: 51 mg/dL — ABNORMAL HIGH (ref <30)

## 2015-09-07 LAB — TSH: TSH: 2.861 u[IU]/mL (ref 0.350–4.500)

## 2015-09-08 LAB — VITAMIN D 25 HYDROXY (VIT D DEFICIENCY, FRACTURES): Vit D, 25-Hydroxy: 21 ng/mL — ABNORMAL LOW (ref 30–100)

## 2015-09-13 ENCOUNTER — Ambulatory Visit (INDEPENDENT_AMBULATORY_CARE_PROVIDER_SITE_OTHER): Payer: Medicaid Other | Admitting: Family Medicine

## 2015-09-13 ENCOUNTER — Encounter: Payer: Self-pay | Admitting: Family Medicine

## 2015-09-13 VITALS — BP 118/70 | HR 64 | Temp 98.1°F | Resp 14 | Ht 62.5 in | Wt 158.0 lb

## 2015-09-13 DIAGNOSIS — Z0000 Encounter for general adult medical examination without abnormal findings: Secondary | ICD-10-CM

## 2015-09-13 DIAGNOSIS — E785 Hyperlipidemia, unspecified: Secondary | ICD-10-CM | POA: Diagnosis not present

## 2015-09-13 MED ORDER — PRAVASTATIN SODIUM 40 MG PO TABS: 40.0000 mg | ORAL_TABLET | Freq: Every evening | ORAL | Status: DC

## 2015-09-13 NOTE — Progress Notes (Signed)
Subjective:    Patient ID: Natalie [name], female    DOB: 12/22/1960, 55 y.o.   MRN: [id number]  HPI  patient is here today for complete physical exam. She has no concerns. She is quit smoking. She divorced her husband since August. However she states that this was positive in her life. She states that she never has felt better. She declines a flu shot today. Her last colonoscopy was in 2012 and was significant for 1 tubular adenoma. She will be due for follow-up in 5-7 years. She is also due for a mammogram. Her Pap smears performed at her gynecologist. Her most recent lab work as listed below: Lab on 09/07/2015  Component Date Value Ref Range Status  . Sodium 09/07/2015 140  135 - 146 mmol/L Final  . Potassium 09/07/2015 4.4  3.5 - 5.3 mmol/L Final  . Chloride 09/07/2015 103  98 - 110 mmol/L Final  . CO2 09/07/2015 27  20 - 31 mmol/L Final  . Glucose, Bld 09/07/2015 90  70 - 99 mg/dL Final  . BUN 09/07/2015 12  7 - 25 mg/dL Final  . Creat 09/07/2015 0.97  0.50 - 1.05 mg/dL Final  . Total Bilirubin 09/07/2015 0.3  0.2 - 1.2 mg/dL Final  . Alkaline Phosphatase 09/07/2015 70  33 - 130 U/L Final  . AST 09/07/2015 16  10 - 35 U/L Final  . ALT 09/07/2015 10  6 - 29 U/L Final  . Total Protein 09/07/2015 6.4  6.1 - 8.1 g/dL Final  . Albumin 09/07/2015 4.1  3.6 - 5.1 g/dL Final  . Calcium 09/07/2015 9.0  8.6 - 10.4 mg/dL Final  . GFR, Est African American 09/07/2015 75  >=60 mL/min Final  . GFR, Est Non African American 09/07/2015 65  >=60 mL/min Final   Comment:   The estimated GFR is a calculation valid for adults (>=74 years old) that uses the CKD-EPI algorithm to adjust for age and sex. It is   not to be used for children, pregnant women, hospitalized patients,    patients on dialysis, or with rapidly changing kidney function. According to the NKDEP, eGFR >89 is normal, 60-89 shows mild impairment, 30-59 shows moderate impairment, 15-29 shows severe impairment and <15 is ESRD.     . TSH  09/07/2015 2.861  0.350 - 4.500 uIU/mL Final  . Cholesterol 09/07/2015 246* 125 - 200 mg/dL Final  . Triglycerides 09/07/2015 256* <150 mg/dL Final  . HDL 09/07/2015 41* >=46 mg/dL Final  . Total CHOL/HDL Ratio 09/07/2015 6.0* <=5.0 Ratio Final  . VLDL 09/07/2015 51* <30 mg/dL Final  . LDL Cholesterol 09/07/2015 154* <130 mg/dL Final   Comment:   Total Cholesterol/HDL Ratio:CHD Risk                        Coronary Heart Disease Risk Table                                        Men       Women          1/2 Average Risk              3.4        3.3              Average Risk              5.0  4.4           2X Average Risk              9.6        7.1           3X Average Risk             23.4       11.0 Use the calculated Patient Ratio above and the CHD Risk table  to determine the patient's CHD Risk.   . WBC 09/07/2015 6.4  4.0 - 10.5 K/uL Final  . RBC 09/07/2015 3.98  3.87 - 5.11 MIL/uL Final  . Hemoglobin 09/07/2015 12.3  12.0 - 15.0 g/dL Final  . HCT 09/07/2015 37.7  36.0 - 46.0 % Final  . MCV 09/07/2015 94.7  78.0 - 100.0 fL Final  . MCH 09/07/2015 30.9  26.0 - 34.0 pg Final  . MCHC 09/07/2015 32.6  30.0 - 36.0 g/dL Final  . RDW 09/07/2015 14.8  11.5 - 15.5 % Final  . Platelets 09/07/2015 219  150 - 400 K/uL Final  . MPV 09/07/2015 9.4  8.6 - 12.4 fL Final  . Neutrophils Relative % 09/07/2015 46  43 - 77 % Final  . Neutro Abs 09/07/2015 2.9  1.7 - 7.7 K/uL Final  . Lymphocytes Relative 09/07/2015 42  12 - 46 % Final  . Lymphs Abs 09/07/2015 2.7  0.7 - 4.0 K/uL Final  . Monocytes Relative 09/07/2015 7  3 - 12 % Final  . Monocytes Absolute 09/07/2015 0.4  0.1 - 1.0 K/uL Final  . Eosinophils Relative 09/07/2015 5  0 - 5 % Final  . Eosinophils Absolute 09/07/2015 0.3  0.0 - 0.7 K/uL Final  . Basophils Relative 09/07/2015 0  0 - 1 % Final  . Basophils Absolute 09/07/2015 0.0  0.0 - 0.1 K/uL Final  . Smear Review 09/07/2015 Criteria for review not met   Final  . Vit D,  25-Hydroxy 09/07/2015 21* 30 - 100 ng/mL Final   Comment: Vitamin D Status           25-OH Vitamin D        Deficiency                <20 ng/mL        Insufficiency         20 - 29 ng/mL        Optimal             > or = 30 ng/mL   For 25-OH Vitamin D testing on patients on D2-supplementation and patients for whom quantitation of D2 and D3 fractions is required, the [facility] 25-OH VIT D, (D2,D3), LC/MS/MS is recommended: order code [id number] (patients > 2 yrs).    Past Medical History  Diagnosis Date  . Bipolar 1 disorder ([place])   . Smoker   . Glaucoma     stage 2  . Hyperlipidemia   . Allergy     seasonal  . Anxiety     bipolar  . Depression    Past Surgical History  Procedure Laterality Date  . Tubal ligation  1993  . Cosmetic surgery  1967    face after accident   Current Outpatient Prescriptions on File Prior to Visit  Medication Sig Dispense Refill  . diphenhydrAMINE (BENADRYL) 25 mg capsule Take 25 mg by mouth every 6 (six) hours as needed. For allergies    . lamoTRIgine (LAMICTAL) 100 MG tablet Take 1 tablet (  100 mg total) by mouth every morning. 30 tablet 0  . PARoxetine (PAXIL) 20 MG tablet Take 1 tablet (20 mg total) by mouth daily.    . QUEtiapine (SEROQUEL) 100 MG tablet Take 1 tablet (100 mg total) by mouth at bedtime.     No current facility-administered medications on file prior to visit.   Allergies  Allergen Reactions  . Sulfa Antibiotics Anaphylaxis and Swelling     flu like sx  . Aspirin Other (See Comments)    Has ulcers  . Codeine Nausea And Vomiting and Other (See Comments)    Stomach pain  . Hydrocodone Nausea And Vomiting  . Mobic [Meloxicam] Nausea And Vomiting  . Tylenol [Acetaminophen] Other (See Comments)    Has ulcers   Social History   Social History  . Marital Status: Married    Spouse Name: N/A  . Number of Children: N/A  . Years of Education: N/A   Occupational History  . Not on file.   Social History Main Topics  .  Smoking status: Former Smoker -- 0.25 packs/day for 15 years    Types: Cigarettes  . Smokeless tobacco: Never Used     Comment: quit 03/2014  . Alcohol Use: No  . Drug Use: No  . Sexual Activity: Not Currently   Other Topics Concern  . Not on file   Social History Narrative      Review of Systems  All other systems reviewed and are negative.      Objective:   Physical Exam  Constitutional: She is oriented to person, place, and time. She appears well-developed and well-nourished. No distress.  HENT:  Head: Normocephalic and atraumatic.  Right Ear: External ear normal.  Left Ear: External ear normal.  Nose: Nose normal.  Mouth/Throat: Oropharynx is clear and moist. No oropharyngeal exudate.  Eyes: Conjunctivae and EOM are normal. Pupils are equal, round, and reactive to light. Right eye exhibits no discharge. Left eye exhibits no discharge. No scleral icterus.  Neck: Normal range of motion. Neck supple. No JVD present. No tracheal deviation present. No thyromegaly present.  Cardiovascular: Normal rate, regular rhythm, normal heart sounds and intact distal pulses.  Exam reveals no gallop and no friction rub.   No murmur heard. Pulmonary/Chest: Effort normal and breath sounds normal. No stridor. No respiratory distress. She has no wheezes. She has no rales. She exhibits no tenderness.  Abdominal: Soft. Bowel sounds are normal. She exhibits no distension and no mass. There is no tenderness. There is no rebound and no guarding.  Musculoskeletal: Normal range of motion. She exhibits no edema or tenderness.  Lymphadenopathy:    She has no cervical adenopathy.  Neurological: She is alert and oriented to person, place, and time. She has normal reflexes. She displays normal reflexes. No cranial nerve deficit. She exhibits normal muscle tone. Coordination normal.  Skin: Skin is warm. No rash noted. She is not diaphoretic. No erythema. No pallor.  Psychiatric: She has a normal mood and  affect. Her behavior is normal. Judgment and thought content normal.  Vitals reviewed.         Assessment & Plan:  HLD (hyperlipidemia) - Plan: pravastatin (PRAVACHOL) 40 MG tablet  Routine general medical examination at a health care facility   Physical exam today is normal. Cholesterol is elevated. I recommended she start pravastatin 40 mg by mouth daily and recheck cholesterol in 3 months. Immunizations are up-to-date. Patient will call her gastroenterologist office and schedule a follow-up appointment for repeat colonoscopy.  I will schedule her for mammogram. Her Pap smear performed at her gynecologist office.

## 2015-09-14 ENCOUNTER — Telehealth: Payer: Self-pay | Admitting: Family Medicine

## 2015-09-14 MED ORDER — TOPIRAMATE 25 MG PO CPSP: 25.0000 mg | ORAL_CAPSULE | Freq: Two times a day (BID) | ORAL | Status: DC

## 2015-09-14 NOTE — Telephone Encounter (Signed)
Start topamax 25 mg pobid and recheck here in 1 month.

## 2015-09-14 NOTE — Telephone Encounter (Signed)
Medication called/sent to requested pharmacy  

## 2015-09-14 NOTE — Telephone Encounter (Signed)
Pt says that she and Dr. Dennard Schaumann had discussed a medication for migraines and weight loss at her last visit. She does not know what the medication was called. Reviewed notes for DOS 09/13/15- no medication of this sort was documented. If he can remember what medication it is she would like for him to call it in to the [facility]. [phone]

## 2015-09-28 ENCOUNTER — Ambulatory Visit: Payer: Self-pay

## 2015-10-16 ENCOUNTER — Ambulatory Visit: Payer: Self-pay

## 2016-02-25 ENCOUNTER — Encounter: Payer: Self-pay | Admitting: Gastroenterology

## 2016-08-29 DIAGNOSIS — H02831 Dermatochalasis of right upper eyelid: Secondary | ICD-10-CM | POA: Diagnosis not present

## 2016-08-29 DIAGNOSIS — H02834 Dermatochalasis of left upper eyelid: Secondary | ICD-10-CM | POA: Diagnosis not present

## 2016-12-05 ENCOUNTER — Ambulatory Visit (INDEPENDENT_AMBULATORY_CARE_PROVIDER_SITE_OTHER): Payer: Medicaid Other | Admitting: Family Medicine

## 2016-12-05 ENCOUNTER — Encounter: Payer: Self-pay | Admitting: Family Medicine

## 2016-12-05 DIAGNOSIS — E7800 Pure hypercholesterolemia, unspecified: Secondary | ICD-10-CM | POA: Diagnosis not present

## 2016-12-05 MED ORDER — PRAVASTATIN SODIUM 40 MG PO TABS: 40.0000 mg | ORAL_TABLET | Freq: Every evening | ORAL | 11 refills | Status: DC

## 2016-12-08 NOTE — Progress Notes (Signed)
Subjective:    Patient ID: Natalie [name], female    DOB: [birth year], 56 y.o.   MRN: [id number]  HPI Last year, the patient was started on pravastatin for hyperlipidemia. She is now been out of medication for over a month. She has never had a fasting lipid panel or CMP taken while taking the medication. Her LDL cholesterol was well over 150 last year off the medication. She continues to smoke. On the medication, she denies any myalgias or right upper quadrant pain. She denies any chest pain or strokelike symptoms. She denies any extremity claudication Past Medical History:  Diagnosis Date  . Allergy    seasonal  . Anxiety    bipolar  . Bipolar 1 disorder ([place])   . Depression   . Glaucoma    stage 2  . Hyperlipidemia   . Smoker    Past Surgical History:  Procedure Laterality Date  . [place]   face after accident  . TUBAL LIGATION  1993   Current Outpatient Prescriptions on File Prior to Visit  Medication Sig Dispense Refill  . buPROPion (WELLBUTRIN SR) 100 MG 12 hr tablet Take 100 mg by mouth every morning.  2  . diphenhydrAMINE (BENADRYL) 25 mg capsule Take 25 mg by mouth every 6 (six) hours as needed. For allergies    . lamoTRIgine (LAMICTAL) 100 MG tablet Take 1 tablet (100 mg total) by mouth every morning. 30 tablet 0  . PARoxetine (PAXIL) 20 MG tablet Take 1 tablet (20 mg total) by mouth daily.    . QUEtiapine (SEROQUEL) 100 MG tablet Take 1 tablet (100 mg total) by mouth at bedtime.    . topiramate (TOPAMAX) 25 MG capsule Take 1 capsule (25 mg total) by mouth 2 (two) times daily. (Patient not taking: Reported on 12/05/2016) 60 capsule 0   No current facility-administered medications on file prior to visit.    Allergies  Allergen Reactions  . Sulfa Antibiotics Anaphylaxis and Swelling     flu like sx  . Aspirin Other (See Comments)    Has ulcers  . Codeine Nausea And Vomiting and Other (See Comments)    Stomach pain  . Hydrocodone Nausea And Vomiting  .  Mobic [Meloxicam] Nausea And Vomiting  . Tylenol [Acetaminophen] Other (See Comments)    Has ulcers   Social History   Social History  . Marital status: Married    Spouse name: N/A  . Number of children: N/A  . Years of education: N/A   Occupational History  . Not on file.   Social History Main Topics  . Smoking status: Former Smoker    Packs/day: 0.25    Years: 15.00    Types: Cigarettes  . Smokeless tobacco: Never Used     Comment: quit 03/2014  . Alcohol use No  . Drug use: No  . Sexual activity: Not Currently   Other Topics Concern  . Not on file   Social History Narrative  . No narrative on file      Review of Systems  All other systems reviewed and are negative.      Objective:   Physical Exam  Constitutional: She is oriented to person, place, and time. She appears well-developed and well-nourished. No distress.  HENT:  Head: Normocephalic and atraumatic.  Right Ear: External ear normal.  Left Ear: External ear normal.  Nose: Nose normal.  Mouth/Throat: Oropharynx is clear and moist. No oropharyngeal exudate.  Eyes: Conjunctivae and EOM are normal. Pupils are  equal, round, and reactive to light. Right eye exhibits no discharge. Left eye exhibits no discharge. No scleral icterus.  Neck: Normal range of motion. Neck supple. No JVD present. No tracheal deviation present. No thyromegaly present.  Cardiovascular: Normal rate, regular rhythm, normal heart sounds and intact distal pulses.  Exam reveals no gallop and no friction rub.   No murmur heard. Pulmonary/Chest: Effort normal and breath sounds normal. No stridor. No respiratory distress. She has no wheezes. She has no rales. She exhibits no tenderness.  Abdominal: Soft. Bowel sounds are normal. She exhibits no distension and no mass. There is no tenderness. There is no rebound and no guarding.  Musculoskeletal: Normal range of motion. She exhibits no edema or tenderness.  Lymphadenopathy:    She has no  cervical adenopathy.  Neurological: She is alert and oriented to person, place, and time. She has normal reflexes. No cranial nerve deficit. She exhibits normal muscle tone. Coordination normal.  Skin: Skin is warm. No rash noted. She is not diaphoretic. No erythema. No pallor.  Psychiatric: She has a normal mood and affect. Her behavior is normal. Judgment and thought content normal.  Vitals reviewed.         Assessment & Plan:  Pure hypercholesterolemia - Plan: CBC with Differential/Platelet, COMPLETE METABOLIC PANEL WITH GFR, Lipid panel, pravastatin (PRAVACHOL) 40 MG tablet  Resume Pravachol 40 mg by mouth daily and recheck a fasting lipid panel and CMP in 3 months. She defers her colonoscopy, mammogram, and Pap smear at her gynecologist office.

## 2017-02-06 ENCOUNTER — Ambulatory Visit: Payer: Medicaid Other | Admitting: Family Medicine

## 2017-02-09 ENCOUNTER — Ambulatory Visit (INDEPENDENT_AMBULATORY_CARE_PROVIDER_SITE_OTHER): Payer: Medicaid Other | Admitting: Family Medicine

## 2017-02-09 ENCOUNTER — Encounter: Payer: Self-pay | Admitting: Family Medicine

## 2017-02-09 VITALS — BP 120/78 | HR 64 | Temp 98.2°F | Resp 16 | Ht 62.0 in | Wt 146.0 lb

## 2017-02-09 DIAGNOSIS — E7800 Pure hypercholesterolemia, unspecified: Secondary | ICD-10-CM | POA: Diagnosis not present

## 2017-02-09 DIAGNOSIS — G43709 Chronic migraine without aura, not intractable, without status migrainosus: Secondary | ICD-10-CM

## 2017-02-09 DIAGNOSIS — L719 Rosacea, unspecified: Secondary | ICD-10-CM

## 2017-02-09 MED ORDER — SUMATRIPTAN SUCCINATE 50 MG PO TABS: 50.0000 mg | ORAL_TABLET | ORAL | 3 refills | Status: DC | PRN

## 2017-02-09 MED ORDER — TRETINOIN 0.025 % EX CREA: TOPICAL_CREAM | Freq: Every day | CUTANEOUS | 5 refills | Status: DC

## 2017-02-09 MED ORDER — MOMETASONE FUROATE 0.1 % EX CREA: 1.0000 "application " | TOPICAL_CREAM | Freq: Every day | CUTANEOUS | 0 refills | Status: DC

## 2017-02-09 NOTE — Progress Notes (Signed)
Subjective:    Patient ID: Natalie [name], female    DOB: 07/22/1961, 56 y.o.   MRN: [id number]  Medication Refill   12/05/16 Last year, the patient was started on pravastatin for hyperlipidemia. She is now been out of medication for over a month. She has never had a fasting lipid panel or CMP taken while taking the medication. Her LDL cholesterol was well over 150 last year off the medication. She continues to smoke. On the medication, she denies any myalgias or right upper quadrant pain. She denies any chest pain or strokelike symptoms. She denies any extremity claudication.  At that time, my plan was: Resume Pravachol 40 mg by mouth daily and recheck a fasting lipid panel and CMP in 3 months. She defers her colonoscopy, mammogram, and Pap smear at her gynecologist office.  02/09/17 A she reports 2-3 severe headaches a week. They tend to be unilateral associated with pain behind the eye, pounding in nature, and associated with photophobia and nausea. She is not taking the Topamax I originally prescribed her to prevent headaches. She currently is taking Excedrin Migraine which seems to help the headaches from happening. She also has a rash on her chin. The rash is usually whiteheads or blackheads a comfortable elderly skin. However she picks at them and causes scarring on her chin. She also has a rash on her right gluteus. The rash consists of 5-7 erythematous papules that appear to be either insect bites or irritation. However she has scratched at them to the point that they're excoriated scars. Thought is that the patient is traumatizing the rash of her fingernails out of a nervous habit.  She is due to recheck a fasting lipid panel on the pravastatin. She denies any myalgias or right upper quadrant pain Past Medical History:  Diagnosis Date  . Allergy    seasonal  . Anxiety    bipolar  . Bipolar 1 disorder ([place])   . Depression   . Glaucoma    stage 2  . Hyperlipidemia   . Smoker    Past  Surgical History:  Procedure Laterality Date  . [place]   face after accident  . TUBAL LIGATION  1993   Current Outpatient Prescriptions on File Prior to Visit  Medication Sig Dispense Refill  . buPROPion (WELLBUTRIN SR) 100 MG 12 hr tablet Take 100 mg by mouth every morning.  2  . diphenhydrAMINE (BENADRYL) 25 mg capsule Take 25 mg by mouth every 6 (six) hours as needed. For allergies    . lamoTRIgine (LAMICTAL) 100 MG tablet Take 1 tablet (100 mg total) by mouth every morning. 30 tablet 0  . PARoxetine (PAXIL) 20 MG tablet Take 1 tablet (20 mg total) by mouth daily.    . pravastatin (PRAVACHOL) 40 MG tablet Take 1 tablet (40 mg total) by mouth every evening. 30 tablet 11  . QUEtiapine (SEROQUEL) 100 MG tablet Take 1 tablet (100 mg total) by mouth at bedtime.    . topiramate (TOPAMAX) 25 MG capsule Take 1 capsule (25 mg total) by mouth 2 (two) times daily. (Patient not taking: Reported on 12/05/2016) 60 capsule 0   No current facility-administered medications on file prior to visit.    Allergies  Allergen Reactions  . Sulfa Antibiotics Anaphylaxis and Swelling     flu like sx  . Aspirin Other (See Comments)    Has ulcers  . Codeine Nausea And Vomiting and Other (See Comments)    Stomach pain  .  Hydrocodone Nausea And Vomiting  . Mobic [Meloxicam] Nausea And Vomiting  . Tylenol [Acetaminophen] Other (See Comments)    Has ulcers   Social History   Social History  . Marital status: Married    Spouse name: N/A  . Number of children: N/A  . Years of education: N/A   Occupational History  . Not on file.   Social History Main Topics  . Smoking status: Former Smoker    Packs/day: 0.25    Years: 15.00    Types: Cigarettes  . Smokeless tobacco: Never Used     Comment: quit 03/2014  . Alcohol use No  . Drug use: No  . Sexual activity: Not Currently   Other Topics Concern  . Not on file   Social History Narrative  . No narrative on file      Review of  Systems  All other systems reviewed and are negative.      Objective:   Physical Exam  Constitutional: She appears well-developed and well-nourished. No distress.  HENT:  Head: Normocephalic and atraumatic.  Eyes: Conjunctivae and EOM are normal. Pupils are equal, round, and reactive to light.  Neck: Normal range of motion. Neck supple.  Cardiovascular: Normal rate, regular rhythm, normal heart sounds and intact distal pulses.  Exam reveals no gallop and no friction rub.   No murmur heard. Pulmonary/Chest: Effort normal and breath sounds normal. No respiratory distress. She has no wheezes. She has no rales. She exhibits no tenderness.  Abdominal: Soft. Bowel sounds are normal.  Musculoskeletal: She exhibits no edema.  Skin: Rash noted. She is not diaphoretic.  Vitals reviewed.         Assessment & Plan:  Pure hypercholesterolemia - Plan: COMPLETE METABOLIC PANEL WITH GFR, Lipid panel  Chronic migraine without aura without status migrainosus, not intractable  Rosacea  I will treat her migraines with Imitrex 50 mg by mouth immediately at the first sign of a migraine then repeated 1 in 2 hours if headache persists. I will treat the mild rosacea with Retin-A cream 0.025% applied daily at bedtime. The rash on her gluteus appears to be a atopic dermatitis made worse by excoriation. I would treat the dermatitis with Elocon cream and I advised the patient to stop traumatizing the rash with her fingernails. I would like her to return fasting for a CMP and fasting lipid panel at her convenience. Goal LDL cholesterol is less than 130.

## 2017-02-11 ENCOUNTER — Other Ambulatory Visit: Payer: Self-pay | Admitting: Family Medicine

## 2017-02-11 MED ORDER — TRIAMCINOLONE ACETONIDE 0.1 % EX CREA: 1.0000 "application " | TOPICAL_CREAM | Freq: Two times a day (BID) | CUTANEOUS | 0 refills | Status: DC

## 2017-05-04 ENCOUNTER — Ambulatory Visit (INDEPENDENT_AMBULATORY_CARE_PROVIDER_SITE_OTHER): Payer: Medicaid Other | Admitting: Family Medicine

## 2017-05-04 ENCOUNTER — Encounter: Payer: Self-pay | Admitting: Family Medicine

## 2017-05-04 VITALS — BP 118/78 | HR 76 | Temp 98.0°F | Resp 14 | Ht 62.0 in | Wt 142.0 lb

## 2017-05-04 DIAGNOSIS — Z0000 Encounter for general adult medical examination without abnormal findings: Secondary | ICD-10-CM | POA: Diagnosis not present

## 2017-05-04 DIAGNOSIS — F17200 Nicotine dependence, unspecified, uncomplicated: Secondary | ICD-10-CM | POA: Diagnosis not present

## 2017-05-04 DIAGNOSIS — E7800 Pure hypercholesterolemia, unspecified: Secondary | ICD-10-CM

## 2017-05-04 DIAGNOSIS — Z1239 Encounter for other screening for malignant neoplasm of breast: Secondary | ICD-10-CM

## 2017-05-04 DIAGNOSIS — Z114 Encounter for screening for human immunodeficiency virus [HIV]: Secondary | ICD-10-CM | POA: Diagnosis not present

## 2017-05-04 DIAGNOSIS — Z1159 Encounter for screening for other viral diseases: Secondary | ICD-10-CM

## 2017-05-04 DIAGNOSIS — Z124 Encounter for screening for malignant neoplasm of cervix: Secondary | ICD-10-CM

## 2017-05-04 DIAGNOSIS — Z1211 Encounter for screening for malignant neoplasm of colon: Secondary | ICD-10-CM

## 2017-05-04 DIAGNOSIS — Z1231 Encounter for screening mammogram for malignant neoplasm of breast: Secondary | ICD-10-CM

## 2017-05-04 NOTE — Progress Notes (Signed)
Subjective:    Patient ID: Natalie [name], female    DOB: [birth year], 56 y.o.   MRN: [id number]  Medication Refill   She is a very pleasant 56 year old white female here today for complete physical exam. She is overdue for colonoscopy, mammogram, hepatitis C screening, HIV screening, a Pap smear, and her flu shot.  Past medical history is significant for hyperlipidemia, tobacco abuse, bipolar disorder, and glaucoma. Past Medical History:  Diagnosis Date  . Allergy    seasonal  . Anxiety    bipolar  . Bipolar 1 disorder ([place])   . Depression   . Glaucoma    stage 2  . Hyperlipidemia   . Smoker    Past Surgical History:  Procedure Laterality Date  . [place]   face after accident  . TUBAL LIGATION  1993   Current Outpatient Prescriptions on File Prior to Visit  Medication Sig Dispense Refill  . buPROPion (WELLBUTRIN SR) 100 MG 12 hr tablet Take 100 mg by mouth every morning.  2  . diphenhydrAMINE (BENADRYL) 25 mg capsule Take 25 mg by mouth every 6 (six) hours as needed. For allergies    . lamoTRIgine (LAMICTAL) 100 MG tablet Take 1 tablet (100 mg total) by mouth every morning. 30 tablet 0  . mometasone (ELOCON) 0.1 % cream Apply 1 application topically daily. 45 g 0  . PARoxetine (PAXIL) 20 MG tablet Take 1 tablet (20 mg total) by mouth daily.    . pravastatin (PRAVACHOL) 40 MG tablet Take 1 tablet (40 mg total) by mouth every evening. 30 tablet 11  . prazosin (MINIPRESS) 2 MG capsule TAKE ONE CAPSULE BY MOUTH AT BEDTIME FOR NIGHTMARES  2  . QUEtiapine (SEROQUEL) 100 MG tablet Take 1 tablet (100 mg total) by mouth at bedtime.    . SUMAtriptan (IMITREX) 50 MG tablet Take 1 tablet (50 mg total) by mouth every 2 (two) hours as needed for migraine. May repeat in 2 hours if headache persists or recurs. 10 tablet 3  . tretinoin (RETIN-A) 0.025 % cream Apply topically at bedtime. 45 g 5  . triamcinolone cream (KENALOG) 0.1 % Apply 1 application topically 2 (two) times daily.  X 7 days 45 g 0   No current facility-administered medications on file prior to visit.    Allergies  Allergen Reactions  . Sulfa Antibiotics Anaphylaxis and Swelling     flu like sx  . Aspirin Other (See Comments)    Has ulcers  . Codeine Nausea And Vomiting and Other (See Comments)    Stomach pain  . Hydrocodone Nausea And Vomiting  . Mobic [Meloxicam] Nausea And Vomiting  . Tylenol [Acetaminophen] Other (See Comments)    Has ulcers   Social History   Social History  . Marital status: Married    Spouse name: N/A  . Number of children: N/A  . Years of education: N/A   Occupational History  . Not on file.   Social History Main Topics  . Smoking status: Former Smoker    Packs/day: 0.25    Years: 15.00    Types: Cigarettes  . Smokeless tobacco: Never Used     Comment: quit 03/2014  . Alcohol use No  . Drug use: No  . Sexual activity: Not Currently   Other Topics Concern  . Not on file   Social History Narrative  . No narrative on file   Family History  Problem Relation Age of Onset  . Colon cancer Maternal Grandfather   .  Hypertension Father   . Hyperlipidemia Father   . Cancer Neg Hx   . Heart disease Neg Hx       Review of Systems  All other systems reviewed and are negative.      Objective:   Physical Exam  Constitutional: She is oriented to person, place, and time. She appears well-developed and well-nourished. No distress.  HENT:  Head: Normocephalic and atraumatic.  Right Ear: External ear normal.  Left Ear: External ear normal.  Nose: Nose normal.  Mouth/Throat: Oropharynx is clear and moist. No oropharyngeal exudate.  Eyes: Pupils are equal, round, and reactive to light. Conjunctivae and EOM are normal. Right eye exhibits no discharge. Left eye exhibits no discharge. No scleral icterus.  Neck: Normal range of motion. Neck supple. No JVD present. No tracheal deviation present. No thyromegaly present.  Cardiovascular: Normal rate, regular  rhythm, normal heart sounds and intact distal pulses.  Exam reveals no gallop and no friction rub.   No murmur heard. Pulmonary/Chest: Effort normal and breath sounds normal. No respiratory distress. She has no wheezes. She has no rales. She exhibits no tenderness. Right breast exhibits no inverted nipple, no mass and no nipple discharge. Left breast exhibits no inverted nipple, no mass and no nipple discharge. Breasts are symmetrical.  Abdominal: Soft. Bowel sounds are normal. She exhibits no distension and no mass. There is no tenderness. There is no rebound and no guarding.  Genitourinary: Vagina normal and uterus normal.  Musculoskeletal: She exhibits no edema.  Lymphadenopathy:    She has no cervical adenopathy.  Neurological: She is alert and oriented to person, place, and time. She has normal reflexes. She displays normal reflexes. No cranial nerve deficit. She exhibits normal muscle tone. Coordination normal.  Skin: No rash noted. She is not diaphoretic. No erythema. No pallor.  Psychiatric: She has a normal mood and affect. Her behavior is normal. Judgment and thought content normal.  Vitals reviewed.         Assessment & Plan:  Routine general medical examination at a health care facility  Cervical cancer screening - Plan: PAP, Thin Prep w/HPV rflx HPV Type 16/18  Pure hypercholesterolemia - Plan: COMPLETE METABOLIC PANEL WITH GFR, CBC with Differential/Platelet, Lipid panel  Smoker  Breast cancer screening - Plan: MM Digital Screening  Encounter for hepatitis C screening test for low risk patient - Plan: Hepatitis C Antibody  Encounter for screening for HIV - Plan: HIV antibody (with reflex)  Continue to encourage smoking cessation.  She has already scheduled a quit date and is planning to try. I will schedule the patient for mammogram. I will also screen the patient for hepatitis C as well as HIV. Pap smear was sent to pathology and labeled container. I encouraged colon  cancer screening with colonoscopy.  She requested that I schedule for her.   I will check a CBC, CMP, fasting lipid panel. I also encouraged the patient to receive her flu shot today.  However, she declined this.

## 2017-05-05 LAB — CBC WITH DIFFERENTIAL/PLATELET
Basophils Absolute: 30 cells/uL (ref 0–200)
Basophils Relative: 0.5 %
Eosinophils Absolute: 258 cells/uL (ref 15–500)
Eosinophils Relative: 4.3 %
HCT: 38.5 % (ref 35.0–45.0)
Hemoglobin: 13.1 g/dL (ref 11.7–15.5)
Lymphs Abs: 2466 cells/uL (ref 850–3900)
MCH: 31.8 pg (ref 27.0–33.0)
MCHC: 34 g/dL (ref 32.0–36.0)
MCV: 93.4 fL (ref 80.0–100.0)
MPV: 10.1 fL (ref 7.5–12.5)
Monocytes Relative: 5.5 %
Neutro Abs: 2916 cells/uL (ref 1500–7800)
Neutrophils Relative %: 48.6 %
Platelets: 189 10*3/uL (ref 140–400)
RBC: 4.12 10*6/uL (ref 3.80–5.10)
RDW: 13.1 % (ref 11.0–15.0)
Total Lymphocyte: 41.1 %
WBC mixed population: 330 cells/uL (ref 200–950)
WBC: 6 10*3/uL (ref 3.8–10.8)

## 2017-05-05 LAB — COMPLETE METABOLIC PANEL WITH GFR
AG Ratio: 2 (calc) (ref 1.0–2.5)
ALT: 15 U/L (ref 6–29)
AST: 19 U/L (ref 10–35)
Albumin: 4.5 g/dL (ref 3.6–5.1)
Alkaline phosphatase (APISO): 74 U/L (ref 33–130)
BUN: 10 mg/dL (ref 7–25)
CO2: 26 mmol/L (ref 20–32)
Calcium: 9.1 mg/dL (ref 8.6–10.4)
Chloride: 103 mmol/L (ref 98–110)
Creat: 0.84 mg/dL (ref 0.50–1.05)
GFR, Est African American: 89 mL/min/{1.73_m2} (ref >=60)
GFR, Est Non African American: 77 mL/min/{1.73_m2} (ref >=60)
Globulin: 2.2 g/dL (calc) (ref 1.9–3.7)
Glucose, Bld: 94 mg/dL (ref 65–99)
Potassium: 4.3 mmol/L (ref 3.5–5.3)
Sodium: 139 mmol/L (ref 135–146)
Total Bilirubin: 0.4 mg/dL (ref 0.2–1.2)
Total Protein: 6.7 g/dL (ref 6.1–8.1)

## 2017-05-05 LAB — LIPID PANEL
Cholesterol: 170 mg/dL (ref <200)
HDL: 50 mg/dL — ABNORMAL LOW (ref >50)
LDL Cholesterol (Calc): 94 mg/dL (calc)
Non-HDL Cholesterol (Calc): 120 mg/dL (calc) (ref <130)
Total CHOL/HDL Ratio: 3.4 (calc) (ref <5.0)
Triglycerides: 156 mg/dL — ABNORMAL HIGH (ref <150)

## 2017-05-05 LAB — HEPATITIS C ANTIBODY
Hepatitis C Ab: NONREACTIVE
SIGNAL TO CUT-OFF: 0.02 (ref <1.00)

## 2017-05-05 LAB — HIV ANTIBODY (ROUTINE TESTING W REFLEX): HIV 1&2 Ab, 4th Generation: NONREACTIVE

## 2017-05-06 LAB — PAP, TP IMAGING W/ HPV RNA, RFLX HPV TYPE 16,18/45: HPV DNA High Risk: NOT DETECTED

## 2017-05-25 ENCOUNTER — Encounter: Payer: Self-pay | Admitting: Family Medicine

## 2017-05-29 ENCOUNTER — Inpatient Hospital Stay: Admission: RE | Admit: 2017-05-29 | Payer: Self-pay | Source: Ambulatory Visit

## 2017-07-06 ENCOUNTER — Encounter: Payer: Self-pay | Admitting: Family Medicine

## 2017-09-15 ENCOUNTER — Ambulatory Visit: Payer: Medicaid Other | Admitting: Family Medicine

## 2017-09-17 ENCOUNTER — Encounter: Payer: Self-pay | Admitting: Family Medicine

## 2017-09-17 ENCOUNTER — Ambulatory Visit (INDEPENDENT_AMBULATORY_CARE_PROVIDER_SITE_OTHER): Payer: Medicaid Other | Admitting: Family Medicine

## 2017-09-17 VITALS — BP 132/68 | HR 62 | Temp 97.4°F | Resp 14 | Ht 62.0 in | Wt 140.0 lb

## 2017-09-17 DIAGNOSIS — H9201 Otalgia, right ear: Secondary | ICD-10-CM

## 2017-09-17 MED ORDER — AMOXICILLIN 875 MG PO TABS: 875.0000 mg | ORAL_TABLET | Freq: Two times a day (BID) | ORAL | 0 refills | Status: DC

## 2017-09-17 NOTE — Progress Notes (Signed)
Subjective:    Patient ID: Natalie [name], female    DOB: 04/29/1961, 57 y.o.   MRN: [id number]  HPI Patient reports 1 week of pain in her right ear.  On examination, the tympanic membrane is not erythematous however there is a significant middle ear effusion bilaterally.  She reports pounding pressure-like pain behind both eardrums.  She reports a dull headache.  She reports tenderness and pain over the right mastoid process.  There is no lymphadenopathy postauricularly or in the anterior cervical chain.  There is no visible herpetiform rash around the ear.  Examination of the throat appears normal without erythema.  The only abnormality seen is a middle ear effusion Past Medical History:  Diagnosis Date  . Allergy    seasonal  . Anxiety    bipolar  . Bipolar 1 disorder ([place])   . Depression   . Glaucoma    stage 2  . Hyperlipidemia   . Smoker    Past Surgical History:  Procedure Laterality Date  . [place]   face after accident  . TUBAL LIGATION  1993   Current Outpatient Medications on File Prior to Visit  Medication Sig Dispense Refill  . diphenhydrAMINE (BENADRYL) 25 mg capsule Take 25 mg by mouth every 6 (six) hours as needed. For allergies    . lamoTRIgine (LAMICTAL) 100 MG tablet Take 1 tablet (100 mg total) by mouth every morning. 30 tablet 0  . mometasone (ELOCON) 0.1 % cream Apply 1 application topically daily. 45 g 0  . PARoxetine (PAXIL) 20 MG tablet Take 1 tablet (20 mg total) by mouth daily.    . pravastatin (PRAVACHOL) 40 MG tablet Take 1 tablet (40 mg total) by mouth every evening. 30 tablet 11  . QUEtiapine (SEROQUEL) 100 MG tablet Take 1 tablet (100 mg total) by mouth at bedtime.    . tretinoin (RETIN-A) 0.025 % cream Apply topically at bedtime. 45 g 5  . triamcinolone cream (KENALOG) 0.1 % Apply 1 application topically 2 (two) times daily. X 7 days 45 g 0   No current facility-administered medications on file prior to visit.    Allergies  Allergen  Reactions  . Sulfa Antibiotics Anaphylaxis and Swelling     flu like sx  . Aspirin Other (See Comments)    Has ulcers  . Codeine Nausea And Vomiting and Other (See Comments)    Stomach pain  . Hydrocodone Nausea And Vomiting  . Mobic [Meloxicam] Nausea And Vomiting  . Tylenol [Acetaminophen] Other (See Comments)    Has ulcers   Social History   Socioeconomic History  . Marital status: Married    Spouse name: Not on file  . Number of children: Not on file  . Years of education: Not on file  . Highest education level: Not on file  Social Needs  . Financial resource strain: Not on file  . Food insecurity - worry: Not on file  . Food insecurity - inability: Not on file  . Transportation needs - medical: Not on file  . Transportation needs - non-medical: Not on file  Occupational History  . Not on file  Tobacco Use  . Smoking status: Former Smoker    Packs/day: 0.25    Years: 15.00    Pack years: 3.75    Types: Cigarettes  . Smokeless tobacco: Never Used  . Tobacco comment: quit 03/2014  Substance and Sexual Activity  . Alcohol use: No    Alcohol/week: 0.0 oz  . Drug  use: No  . Sexual activity: Not Currently  Other Topics Concern  . Not on file  Social History Narrative  . Not on file      Review of Systems  All other systems reviewed and are negative.      Objective:   Physical Exam  HENT:  Right Ear: External ear and ear canal normal. Tympanic membrane is bulging. Tympanic membrane is not injected, not scarred and not perforated. A middle ear effusion is present.  Left Ear: External ear and ear canal normal. A middle ear effusion is present.  Mouth/Throat: Oropharynx is clear and moist. No oropharyngeal exudate.  Cardiovascular: Normal rate, regular rhythm and normal heart sounds.  Pulmonary/Chest: Effort normal and breath sounds normal. No respiratory distress. She has no wheezes. She has no rales.  Vitals reviewed.         Assessment & Plan:  Acute  otalgia, right  Exam is significant for a middle ear effusion bilaterally and eustachian tube dysfunction.  Therefore I will treat the patient with amoxicillin 875 mg p.o. twice daily for 10 days and I have recommended Flonase 2 sprays each nostril daily over-the-counter along with Zyrtec 10 mg daily over-the-counter.  If symptoms worsen recheck immediately.  Also in the differential diagnosis would be possible TMJ given her other medical history.

## 2017-12-14 ENCOUNTER — Encounter: Payer: Self-pay | Admitting: Physician Assistant

## 2017-12-14 ENCOUNTER — Ambulatory Visit: Payer: Medicaid Other | Admitting: Physician Assistant

## 2017-12-14 ENCOUNTER — Other Ambulatory Visit: Payer: Self-pay

## 2017-12-14 VITALS — BP 122/70 | HR 67 | Temp 98.2°F | Resp 14 | Ht 62.0 in | Wt 139.2 lb

## 2017-12-14 DIAGNOSIS — W57XXXA Bitten or stung by nonvenomous insect and other nonvenomous arthropods, initial encounter: Secondary | ICD-10-CM

## 2017-12-14 DIAGNOSIS — B9689 Other specified bacterial agents as the cause of diseases classified elsewhere: Secondary | ICD-10-CM | POA: Diagnosis not present

## 2017-12-14 DIAGNOSIS — J988 Other specified respiratory disorders: Secondary | ICD-10-CM | POA: Diagnosis not present

## 2017-12-14 DIAGNOSIS — S50861A Insect bite (nonvenomous) of right forearm, initial encounter: Secondary | ICD-10-CM

## 2017-12-14 MED ORDER — DOXYCYCLINE HYCLATE 100 MG PO TABS: 100.0000 mg | ORAL_TABLET | Freq: Two times a day (BID) | ORAL | 0 refills | Status: DC

## 2017-12-14 NOTE — Progress Notes (Signed)
Patient ID: Natalie [name] MRN: [id number], DOB: [birth year], 57 y.o. Date of Encounter: 12/14/2017, 12:37 PM    Chief Complaint:  Chief Complaint  Patient presents with  . Tick Removal    on right arm happened on friday   . chills/ fatigue     HPI: 57 y.o. year old female presents with above.   Reports that this past Friday which was 12/11/2017 she found a tick on her right upper arm.  Pulled it off.  States that over the weekend she "was not feeling good ".  Had decreased appetite and stayed in the bed a lot of the weekend.  However says that she had a chest cold last week and still has cough and chest congestion.  Also not sure if some of her symptoms are because of hot flashes.  Has had no known definite / documented fever.  Has seen no other area of rash except the area at the tick bite.  Was a smoker in the past but has quit smoking.     Home Meds:   Outpatient Medications Prior to Visit  Medication Sig Dispense Refill  . diphenhydrAMINE (BENADRYL) 25 mg capsule Take 25 mg by mouth every 6 (six) hours as needed. For allergies    . lamoTRIgine (LAMICTAL) 100 MG tablet Take 1 tablet (100 mg total) by mouth every morning. 30 tablet 0  . mometasone (ELOCON) 0.1 % cream Apply 1 application topically daily. 45 g 0  . PARoxetine (PAXIL) 20 MG tablet Take 1 tablet (20 mg total) by mouth daily.    . pravastatin (PRAVACHOL) 40 MG tablet Take 1 tablet (40 mg total) by mouth every evening. 30 tablet 11  . QUEtiapine (SEROQUEL) 100 MG tablet Take 1 tablet (100 mg total) by mouth at bedtime.    . tretinoin (RETIN-A) 0.025 % cream Apply topically at bedtime. 45 g 5  . triamcinolone cream (KENALOG) 0.1 % Apply 1 application topically 2 (two) times daily. X 7 days 45 g 0  . amoxicillin (AMOXIL) 875 MG tablet Take 1 tablet (875 mg total) by mouth 2 (two) times daily. 20 tablet 0   No facility-administered medications prior to visit.     Allergies:  Allergies  Allergen Reactions  . Sulfa  Antibiotics Anaphylaxis and Swelling     flu like sx  . Aspirin Other (See Comments)    Has ulcers  . Codeine Nausea And Vomiting and Other (See Comments)    Stomach pain  . Hydrocodone Nausea And Vomiting  . Mobic [Meloxicam] Nausea And Vomiting  . Tylenol [Acetaminophen] Other (See Comments)    Has ulcers      Review of Systems: See HPI for pertinent ROS. All other ROS negative.    Physical Exam: Blood pressure 122/70, pulse 67, temperature 98.2 F (36.8 C), temperature source Oral, resp. rate 14, height 5\' 2"  (1.575 m), weight 63.1 kg (139 lb 3.2 oz), last menstrual period 11/17/2010, SpO2 97 %., Body mass index is 25.46 kg/m. General:  WNWD WF. Appears in no acute distress. HEENT: Normocephalic, atraumatic, eyes without discharge, sclera non-icteric, nares are without discharge. Bilateral auditory canals clear, TM's are without perforation, pearly grey and translucent with reflective cone of light bilaterally. Oral cavity moist, posterior pharynx without exudate, erythema, peritonsillar abscess.  Neck: Supple. No thyromegaly. No lymphadenopathy. Lungs: Clear bilaterally to auscultation without wheezes, rales, or rhonchi. Breathing is unlabored. Heart: Regular rhythm. No murmurs, rubs, or gallops. Msk:  Strength and tone normal for age. Extremities/Skin: Right  Upper Arm--Medial Aspect---there is 4mm area of scab surrounded by ~ 0.5cm diameter of diffuse pink coloration. Around the periphery of this---there is purplish ecchymosis. No bulls eye / target lesion. No other areas of rash.  Neuro: Alert and oriented X 3. Moves all extremities spontaneously. Gait is normal. CNII-XII grossly in tact. Psych:  Responds to questions appropriately with a normal affect.     ASSESSMENT AND PLAN:  57 y.o. year old female with  1. Tick bite, initial encounter I am not convinced that she has Lyme or [facility] spotted fever for certain, but given her symptoms and persistent cough, will go ahead  and treat with doxycycline.  She is to take the doxycycline as directed.  Follow-up if any symptoms persist, do not resolve. - doxycycline (VIBRA-TABS) 100 MG tablet; Take 1 tablet (100 mg total) by mouth 2 (two) times daily.  Dispense: 20 tablet; Refill: 0  2. Bacterial respiratory infection I am not convinced that she has Lyme or [facility] spotted fever for certain, but given her symptoms and persistent cough, will go ahead and treat with doxycycline.  She is to take the doxycycline as directed.  Follow-up if any symptoms persist, do not resolve. - doxycycline (VIBRA-TABS) 100 MG tablet; Take 1 tablet (100 mg total) by mouth 2 (two) times daily.  Dispense: 20 tablet; Refill: 0   Signed, [address] [place], [place], [facility] 12/14/2017 12:37 PM

## 2018-01-01 ENCOUNTER — Other Ambulatory Visit: Payer: Self-pay | Admitting: Family Medicine

## 2018-01-01 DIAGNOSIS — E7800 Pure hypercholesterolemia, unspecified: Secondary | ICD-10-CM

## 2018-03-10 ENCOUNTER — Encounter: Payer: Self-pay | Admitting: Physician Assistant

## 2018-03-10 ENCOUNTER — Ambulatory Visit: Payer: Medicaid Other | Admitting: Physician Assistant

## 2018-03-10 VITALS — BP 112/78 | HR 72 | Temp 98.0°F | Resp 16 | Ht 62.0 in | Wt 141.8 lb

## 2018-03-10 DIAGNOSIS — R238 Other skin changes: Secondary | ICD-10-CM

## 2018-03-10 MED ORDER — DOXYCYCLINE HYCLATE 100 MG PO TABS: 100.0000 mg | ORAL_TABLET | Freq: Two times a day (BID) | ORAL | 0 refills | Status: DC

## 2018-03-10 NOTE — Progress Notes (Signed)
Patient ID: Natalie [name] MRN: [id number], DOB: 08/12/1960, 57 y.o. Date of Encounter: 03/10/2018, 12:16 PM    Chief Complaint:  Chief Complaint  Patient presents with  . sores on legs     HPI: 57 y.o. year old female presents with above.   States that she has sores on the backs of her upper thighs.  States that she will realize that she has been scratching at them and then that causes scabs. Came and to get treatment for this.  No other concerns to address today.     Home Meds:   Outpatient Medications Prior to Visit  Medication Sig Dispense Refill  . lamoTRIgine (LAMICTAL) 100 MG tablet Take 1 tablet (100 mg total) by mouth every morning. 30 tablet 0  . PARoxetine (PAXIL) 20 MG tablet Take 1 tablet (20 mg total) by mouth daily.    . pravastatin (PRAVACHOL) 40 MG tablet TAKE 1 TABLET BY MOUTH EVERY EVENING 90 tablet 1  . QUEtiapine (SEROQUEL) 100 MG tablet Take 1 tablet (100 mg total) by mouth at bedtime.    . tretinoin (RETIN-A) 0.025 % cream Apply topically at bedtime. 45 g 5  . triamcinolone cream (KENALOG) 0.1 % APPLY 1 APPLICATION TOPICALLY 2 TIMES DAILY FOR 7 DAYS 45 g 0  . diphenhydrAMINE (BENADRYL) 25 mg capsule Take 25 mg by mouth every 6 (six) hours as needed. For allergies    . doxycycline (VIBRA-TABS) 100 MG tablet Take 1 tablet (100 mg total) by mouth 2 (two) times daily. 20 tablet 0  . mometasone (ELOCON) 0.1 % cream Apply 1 application topically daily. 45 g 0   No facility-administered medications prior to visit.     Allergies:  Allergies  Allergen Reactions  . Sulfa Antibiotics Anaphylaxis and Swelling     flu like sx  . Aspirin Other (See Comments)    Has ulcers  . Codeine Nausea And Vomiting and Other (See Comments)    Stomach pain  . Hydrocodone Nausea And Vomiting  . Mobic [Meloxicam] Nausea And Vomiting  . Tylenol [Acetaminophen] Other (See Comments)    Has ulcers      Review of Systems: See HPI for pertinent ROS. All other ROS negative.     Physical Exam: Blood pressure 112/78, pulse 72, temperature 98 F (36.7 C), temperature source Oral, resp. rate 16, height 5\' 2"  (1.575 m), weight 64.3 kg (141 lb 12.8 oz), last menstrual period 11/17/2010, SpO2 98 %., Body mass index is 25.94 kg/m. General:  WF. Appears in no acute distress. Neck: Supple. No thyromegaly. No lymphadenopathy. Lungs: Clear bilaterally to auscultation without wheezes, rales, or rhonchi. Breathing is unlabored. Heart: Regular rhythm. No murmurs, rubs, or gallops. Msk:  Strength and tone normal for age. Extremities/Skin:  Skin of buttocks and upper posterior thighs-- bilaterally---with areas of scabs --- c/w skin lesions that she has scratched, leaving excoriations.  There are no active draining sites at present.  All sites are dry at present time.  No abscesses are present.  There are many / multiple areas of scar consistent with prior papules/skin lesions --- on posterior upper thighs, buttocks bilaterally. Neuro: Alert and oriented X 3. Moves all extremities spontaneously. Gait is normal. CNII-XII grossly in tact. Psych:  Responds to questions appropriately with a normal affect.     ASSESSMENT AND PLAN:  57 y.o. year old female with  1. Excoriated papule Reviewed chart and I see no prior visit for treatment of abscess/cellulitis etc.  Also reviewed lab section but see  where she has had no culture of any skin lesions.  Suspect that she has colonization/infection on skin.  Possible MRSA.  All sites are dry so  will not culture today.  Reviewed that she does have allergy to sulfa so will avoid Bactrim.  Treat with doxycycline.  She is to take the doxycycline twice daily x7 days.  Follow-up if needed.  I have also instructed her to keep her hands off of the site.  She does realize that she has scratched the area or has needed to touch the area to get on clothing etc. and she needs to make sure to wash with soap very well.  Discussed risk of spreading infection. -  doxycycline (VIBRA-TABS) 100 MG tablet; Take 1 tablet (100 mg total) by mouth 2 (two) times daily.  Dispense: 14 tablet; Refill: 0   Signed, [id number] [place], [place], [facility] 03/10/2018 12:16 PM

## 2018-08-10 DIAGNOSIS — F319 Bipolar disorder, unspecified: Secondary | ICD-10-CM | POA: Diagnosis not present

## 2018-08-10 DIAGNOSIS — F4310 Post-traumatic stress disorder, unspecified: Secondary | ICD-10-CM | POA: Diagnosis not present

## 2018-08-17 ENCOUNTER — Ambulatory Visit: Payer: Medicaid Other | Admitting: Family Medicine

## 2018-08-17 ENCOUNTER — Encounter: Payer: Self-pay | Admitting: Family Medicine

## 2018-08-17 VITALS — BP 120/74 | HR 68 | Temp 98.4°F | Resp 16 | Ht 62.0 in | Wt 142.0 lb

## 2018-08-17 DIAGNOSIS — R51 Headache: Secondary | ICD-10-CM | POA: Diagnosis not present

## 2018-08-17 DIAGNOSIS — R519 Other chronic pain: Secondary | ICD-10-CM

## 2018-08-17 DIAGNOSIS — G8929 Other chronic pain: Secondary | ICD-10-CM

## 2018-08-17 MED ORDER — TOPIRAMATE 25 MG PO TABS: 50.0000 mg | ORAL_TABLET | Freq: Two times a day (BID) | ORAL | 1 refills | Status: DC

## 2018-08-17 NOTE — Progress Notes (Signed)
Subjective:    Patient ID: Natalie [name], female    DOB: 1960-11-04, 58 y.o.   MRN: [id number]  HPI Patient presents today complaining of a right-sided headache off-and-on for almost 1 year.  Over the last 6 months it has become chronic and daily.  It is located just anterior to the right ear.  It radiates behind the right eye.  It is pulsatile.  It is associated with photophobia and phonophobia.  She will occasionally see blurry vision with a headache.  It can cause nausea but no vomiting.  She denies any scotoma or scintillating lines.  She does have a family history of migraines in both her brother and another relative.  She has never had chronic daily headaches before.  She denies any rhinorrhea or lacrimation.  She denies any head trauma.  She denies any neurologic deficit.  Neurologic exam today is completely normal.  There is no tenderness to palpation over the temporal artery.  Pain radiates in front of the ear up and around behind the ear and sometimes down into the right side of the neck.  She is taking Excedrin Migraine on a daily basis Past Medical History:  Diagnosis Date  . Allergy    seasonal  . Anxiety    bipolar  . Bipolar 1 disorder ([place])   . Depression   . Glaucoma    stage 2  . Hyperlipidemia   . Smoker    Past Surgical History:  Procedure Laterality Date  . [place]   face after accident  . TUBAL LIGATION  1993   Current Outpatient Medications on File Prior to Visit  Medication Sig Dispense Refill  . lamoTRIgine (LAMICTAL) 100 MG tablet Take 1 tablet (100 mg total) by mouth every morning. 30 tablet 0  . PARoxetine (PAXIL) 20 MG tablet Take 1 tablet (20 mg total) by mouth daily.    . pravastatin (PRAVACHOL) 40 MG tablet TAKE 1 TABLET BY MOUTH EVERY EVENING 90 tablet 1  . QUEtiapine (SEROQUEL) 100 MG tablet Take 1 tablet (100 mg total) by mouth at bedtime.    . tretinoin (RETIN-A) 0.025 % cream Apply topically at bedtime. 45 g 5  . triamcinolone cream  (KENALOG) 0.1 % APPLY 1 APPLICATION TOPICALLY 2 TIMES DAILY FOR 7 DAYS 45 g 0   No current facility-administered medications on file prior to visit.    Allergies  Allergen Reactions  . Sulfa Antibiotics Anaphylaxis and Swelling     flu like sx  . Aspirin Other (See Comments)    Has ulcers  . Codeine Nausea And Vomiting and Other (See Comments)    Stomach pain  . Hydrocodone Nausea And Vomiting  . Mobic [Meloxicam] Nausea And Vomiting  . Tylenol [Acetaminophen] Other (See Comments)    Has ulcers   Social History   Socioeconomic History  . Marital status: Married    Spouse name: Not on file  . Number of children: Not on file  . Years of education: Not on file  . Highest education level: Not on file  Occupational History  . Not on file  Social Needs  . Financial resource strain: Not on file  . Food insecurity:    Worry: Not on file    Inability: Not on file  . Transportation needs:    Medical: Not on file    Non-medical: Not on file  Tobacco Use  . Smoking status: Current Every Day Smoker    Packs/day: 0.25    Years: 15.00  Pack years: 3.75    Types: Cigarettes  . Smokeless tobacco: Never Used  . Tobacco comment: quit 03/2014  Substance and Sexual Activity  . Alcohol use: No    Alcohol/week: 0.0 standard drinks  . Drug use: No  . Sexual activity: Not Currently  Lifestyle  . Physical activity:    Days per week: Not on file    Minutes per session: Not on file  . Stress: Not on file  Relationships  . Social connections:    Talks on phone: Not on file    Gets together: Not on file    Attends religious service: Not on file    Active member of club or organization: Not on file    Attends meetings of clubs or organizations: Not on file    Relationship status: Not on file  . Intimate partner violence:    Fear of current or ex partner: Not on file    Emotionally abused: Not on file    Physically abused: Not on file    Forced sexual activity: Not on file  Other  Topics Concern  . Not on file  Social History Narrative  . Not on file      Review of Systems  All other systems reviewed and are negative.      Objective:   Physical Exam Vitals signs reviewed.  Constitutional:      General: She is not in acute distress.    Appearance: She is well-developed and normal weight. She is not ill-appearing, toxic-appearing or diaphoretic.  HENT:     Head: Normocephalic and atraumatic.  Eyes:     General: No visual field deficit or scleral icterus.    Extraocular Movements: Extraocular movements intact.     Right eye: Normal extraocular motion and no nystagmus.     Left eye: Normal extraocular motion.     Pupils: Pupils are equal, round, and reactive to light. Pupils are equal.     Right eye: Pupil is round and reactive.     Left eye: Pupil is round and reactive.  Neck:     Musculoskeletal: Normal range of motion and neck supple.  Cardiovascular:     Rate and Rhythm: Normal rate and regular rhythm.     Heart sounds: Normal heart sounds. No murmur. No friction rub. No gallop.   Pulmonary:     Effort: Pulmonary effort is normal. No respiratory distress.     Breath sounds: Normal breath sounds. No stridor. No wheezing or rales.  Abdominal:     General: Bowel sounds are normal.     Palpations: Abdomen is soft.  Neurological:     Mental Status: She is alert.     Cranial Nerves: No cranial nerve deficit, dysarthria or facial asymmetry.     Sensory: No sensory deficit.     Motor: No weakness.     Coordination: Romberg sign negative. Coordination normal.     Gait: Gait normal.  Psychiatric:        Mood and Affect: Mood normal.        Speech: Speech normal.        Behavior: Behavior normal.           Assessment & Plan:  Chronic right-sided headache  Differential diagnosis includes migraine, chronic tension headache, trigeminal neuralgia, cluster headache, temporal arteritis.  On her presentation the chronicity, I believe this is most  likely migraines.  I recommended starting Topamax 25 mg daily for 1 week.  She will increase by 25  mg every week until she is eventually taking 50 mg twice daily in 1 month.  I would like to see the patient back in 1 month to reassess her headache frequency and severity or sooner if getting worse.  I recommended against taking Excedrin Migraine on a daily basis due to the possibility of rebound headache

## 2018-09-03 ENCOUNTER — Encounter: Payer: Self-pay | Admitting: Family Medicine

## 2018-09-03 ENCOUNTER — Ambulatory Visit: Payer: Medicaid Other | Admitting: Family Medicine

## 2018-09-03 VITALS — BP 126/80 | HR 66 | Temp 97.8°F | Resp 16 | Ht 62.0 in | Wt 143.0 lb

## 2018-09-03 DIAGNOSIS — R51 Headache: Secondary | ICD-10-CM | POA: Diagnosis not present

## 2018-09-03 DIAGNOSIS — G8929 Other chronic pain: Secondary | ICD-10-CM

## 2018-09-03 DIAGNOSIS — R519 Headache, unspecified: Secondary | ICD-10-CM

## 2018-09-03 MED ORDER — TIZANIDINE HCL 4 MG PO TABS: 4.0000 mg | ORAL_TABLET | Freq: Four times a day (QID) | ORAL | 0 refills | Status: DC | PRN

## 2018-09-03 MED ORDER — ERENUMAB-AOOE 70 MG/ML ~~LOC~~ SOAJ: 70.0000 mg | SUBCUTANEOUS | 3 refills | Status: DC

## 2018-09-03 NOTE — Progress Notes (Signed)
Subjective:    Patient ID: Natalie [name], female    DOB: 01/12/1961, 58 y.o.   MRN: [id number]  HPI  08/07/18 Patient presents today complaining of a right-sided headache off-and-on for almost 1 year.  Over the last 6 months it has become chronic and daily.  It is located just anterior to the right ear.  It radiates behind the right eye.  It is pulsatile.  It is associated with photophobia and phonophobia.  She will occasionally see blurry vision with a headache.  It can cause nausea but no vomiting.  She denies any scotoma or scintillating lines.  She does have a family history of migraines in both her brother and another relative.  She has never had chronic daily headaches before.  She denies any rhinorrhea or lacrimation.  She denies any head trauma.  She denies any neurologic deficit.  Neurologic exam today is completely normal.  There is no tenderness to palpation over the temporal artery.  Pain radiates in front of the ear up and around behind the ear and sometimes down into the right side of the neck.  She is taking Excedrin Migraine on a daily basis.  AT that time, my plan was: Differential diagnosis includes migraine, chronic tension headache, trigeminal neuralgia, cluster headache, temporal arteritis.  On her presentation the chronicity, I believe this is most likely migraines.  I recommended starting Topamax 25 mg daily for 1 week.  She will increase by 25 mg every week until she is eventually taking 50 mg twice daily in 1 month.  I would like to see the patient back in 1 month to reassess her headache frequency and severity or sooner if getting worse.  I recommended against taking Excedrin Migraine on a daily basis due to the possibility of rebound headache  09/03/18 Patient was only able to take Topamax for 1 week.  She reports feeling extremely dizzy.  Her balance was extremely compromised.  She felt nauseated.  Therefore she discontinued the medication after 7 days.  However she continues to  complain of severe unrelenting headaches that occur on a daily basis.  She is still taking Excedrin Migraine every day despite my recommendations to discontinue this due to caffeine withdrawal and rebound headache possibilities.  She continues to report light and sound sensitivity and nausea associated with a headache.  The headache continues to be located behind her right eye and is sharp and stabbing in nature.  Past Medical History:  Diagnosis Date  . Allergy    seasonal  . Anxiety    bipolar  . Bipolar 1 disorder ([place])   . Depression   . Glaucoma    stage 2  . Hyperlipidemia   . Smoker    Past Surgical History:  Procedure Laterality Date  . [place]   face after accident  . TUBAL LIGATION  1993   Current Outpatient Medications on File Prior to Visit  Medication Sig Dispense Refill  . lamoTRIgine (LAMICTAL) 100 MG tablet Take 1 tablet (100 mg total) by mouth every morning. 30 tablet 0  . PARoxetine (PAXIL) 20 MG tablet Take 1 tablet (20 mg total) by mouth daily.    . pravastatin (PRAVACHOL) 40 MG tablet TAKE 1 TABLET BY MOUTH EVERY EVENING 90 tablet 1  . QUEtiapine (SEROQUEL) 100 MG tablet Take 1 tablet (100 mg total) by mouth at bedtime.    . topiramate (TOPAMAX) 25 MG tablet Take 2 tablets (50 mg total) by mouth 2 (two) times daily.  120 tablet 1  . tretinoin (RETIN-A) 0.025 % cream Apply topically at bedtime. 45 g 5  . triamcinolone cream (KENALOG) 0.1 % APPLY 1 APPLICATION TOPICALLY 2 TIMES DAILY FOR 7 DAYS 45 g 0   No current facility-administered medications on file prior to visit.    Allergies  Allergen Reactions  . Sulfa Antibiotics Anaphylaxis and Swelling     flu like sx  . Aspirin Other (See Comments)    Has ulcers  . Codeine Nausea And Vomiting and Other (See Comments)    Stomach pain  . Hydrocodone Nausea And Vomiting  . Mobic [Meloxicam] Nausea And Vomiting  . Tylenol [Acetaminophen] Other (See Comments)    Has ulcers   Social History    Socioeconomic History  . Marital status: Married    Spouse name: Not on file  . Number of children: Not on file  . Years of education: Not on file  . Highest education level: Not on file  Occupational History  . Not on file  Social Needs  . Financial resource strain: Not on file  . Food insecurity:    Worry: Not on file    Inability: Not on file  . Transportation needs:    Medical: Not on file    Non-medical: Not on file  Tobacco Use  . Smoking status: Current Every Day Smoker    Packs/day: 0.25    Years: 15.00    Pack years: 3.75    Types: Cigarettes  . Smokeless tobacco: Never Used  . Tobacco comment: quit 03/2014  Substance and Sexual Activity  . Alcohol use: No    Alcohol/week: 0.0 standard drinks  . Drug use: No  . Sexual activity: Not Currently  Lifestyle  . Physical activity:    Days per week: Not on file    Minutes per session: Not on file  . Stress: Not on file  Relationships  . Social connections:    Talks on phone: Not on file    Gets together: Not on file    Attends religious service: Not on file    Active member of club or organization: Not on file    Attends meetings of clubs or organizations: Not on file    Relationship status: Not on file  . Intimate partner violence:    Fear of current or ex partner: Not on file    Emotionally abused: Not on file    Physically abused: Not on file    Forced sexual activity: Not on file  Other Topics Concern  . Not on file  Social History Narrative  . Not on file      Review of Systems  All other systems reviewed and are negative.      Objective:   Physical Exam Vitals signs reviewed.  Constitutional:      General: She is not in acute distress.    Appearance: She is well-developed and normal weight. She is not ill-appearing, toxic-appearing or diaphoretic.  HENT:     Head: Normocephalic and atraumatic.  Eyes:     General: No visual field deficit or scleral icterus.    Extraocular Movements:  Extraocular movements intact.     Right eye: Normal extraocular motion and no nystagmus.     Left eye: Normal extraocular motion.     Pupils: Pupils are equal, round, and reactive to light. Pupils are equal.     Right eye: Pupil is round and reactive.     Left eye: Pupil is round and reactive.  Neck:     Musculoskeletal: Normal range of motion and neck supple.  Cardiovascular:     Rate and Rhythm: Normal rate and regular rhythm.     Heart sounds: Normal heart sounds. No murmur. No friction rub. No gallop.   Pulmonary:     Effort: Pulmonary effort is normal. No respiratory distress.     Breath sounds: Normal breath sounds. No stridor. No wheezing or rales.  Abdominal:     General: Bowel sounds are normal.     Palpations: Abdomen is soft.  Neurological:     Mental Status: She is alert.     Cranial Nerves: No cranial nerve deficit, dysarthria or facial asymmetry.     Sensory: No sensory deficit.     Motor: No weakness.     Coordination: Romberg sign negative. Coordination normal.     Gait: Gait normal.  Psychiatric:        Mood and Affect: Mood normal.        Speech: Speech normal.        Behavior: Behavior normal.           Assessment & Plan:  Chronic right-sided headache - Plan: MR Brain W Wo Contrast  Chronic intractable headache, unspecified headache type - Plan: MR Brain W Wo Contrast  Discontinue Topamax.  Begin Aimovig 70 mg subcu every month.  Use Zanaflex 4 mg every 6 hours as needed for headache.  Discontinue Excedrin Migraine due to rebound headache.  Given the severity of the headache and the persistent nature of the headache despite conservative therapy, I will obtain an MRI of the brain to rule out space-occupying lesion behind the right.  Follow-up in 1 month

## 2018-09-07 ENCOUNTER — Ambulatory Visit: Payer: Medicaid Other

## 2018-09-16 ENCOUNTER — Other Ambulatory Visit: Payer: Self-pay

## 2018-09-20 ENCOUNTER — Ambulatory Visit: Payer: Medicaid Other | Admitting: Family Medicine

## 2018-09-26 ENCOUNTER — Ambulatory Visit
Admission: RE | Admit: 2018-09-26 | Discharge: 2018-09-26 | Disposition: A | Discharge status: Home / Self Care | Payer: Medicaid Other | Source: Ambulatory Visit | Attending: Family Medicine | Admitting: Family Medicine

## 2018-09-26 DIAGNOSIS — G8929 Other chronic pain: Secondary | ICD-10-CM

## 2018-09-26 DIAGNOSIS — R51 Headache: Principal | ICD-10-CM

## 2018-09-26 DIAGNOSIS — R519 Headache, unspecified: Secondary | ICD-10-CM

## 2018-09-26 MED ORDER — GADOBENATE DIMEGLUMINE 529 MG/ML IV SOLN
14.0000 mL | Freq: Once | INTRAVENOUS | Status: AC | PRN
Start: 2018-09-26 — End: 2018-09-26
  Administered 2018-09-26: 14 mL via INTRAVENOUS

## 2018-10-06 ENCOUNTER — Encounter: Payer: Self-pay | Admitting: Family Medicine

## 2018-10-08 ENCOUNTER — Other Ambulatory Visit: Payer: Self-pay | Admitting: Family Medicine

## 2018-10-08 NOTE — Telephone Encounter (Signed)
Requesting refill    Tizanidine  LOV: 09/03/18  LRF:  09/03/18

## 2018-10-12 DIAGNOSIS — H00021 Hordeolum internum right upper eyelid: Secondary | ICD-10-CM | POA: Diagnosis not present

## 2018-11-25 ENCOUNTER — Other Ambulatory Visit: Payer: Self-pay | Admitting: Family Medicine

## 2018-11-25 DIAGNOSIS — E7800 Pure hypercholesterolemia, unspecified: Secondary | ICD-10-CM

## 2018-11-25 MED ORDER — PRAVASTATIN SODIUM 40 MG PO TABS: 40.0000 mg | ORAL_TABLET | Freq: Every evening | ORAL | 1 refills | Status: DC

## 2018-12-24 ENCOUNTER — Other Ambulatory Visit: Payer: Self-pay | Admitting: Family Medicine

## 2018-12-28 NOTE — Telephone Encounter (Signed)
Requesting refill    Tizanidine  LOV: 09/03/18  LRF:   10/08/18

## 2019-01-27 ENCOUNTER — Other Ambulatory Visit: Payer: Self-pay | Admitting: Family Medicine

## 2019-03-09 DIAGNOSIS — F4310 Post-traumatic stress disorder, unspecified: Secondary | ICD-10-CM | POA: Diagnosis not present

## 2019-03-09 DIAGNOSIS — F3162 Bipolar disorder, current episode mixed, moderate: Secondary | ICD-10-CM | POA: Diagnosis not present

## 2019-03-09 DIAGNOSIS — F5101 Primary insomnia: Secondary | ICD-10-CM | POA: Diagnosis not present

## 2019-04-07 DIAGNOSIS — F3162 Bipolar disorder, current episode mixed, moderate: Secondary | ICD-10-CM | POA: Diagnosis not present

## 2019-04-07 DIAGNOSIS — F4310 Post-traumatic stress disorder, unspecified: Secondary | ICD-10-CM | POA: Diagnosis not present

## 2019-04-07 DIAGNOSIS — F5101 Primary insomnia: Secondary | ICD-10-CM | POA: Diagnosis not present

## 2019-05-07 DIAGNOSIS — Z23 Encounter for immunization: Secondary | ICD-10-CM | POA: Diagnosis not present

## 2019-05-31 DIAGNOSIS — F3162 Bipolar disorder, current episode mixed, moderate: Secondary | ICD-10-CM | POA: Diagnosis not present

## 2019-05-31 DIAGNOSIS — F5101 Primary insomnia: Secondary | ICD-10-CM | POA: Diagnosis not present

## 2019-05-31 DIAGNOSIS — F4310 Post-traumatic stress disorder, unspecified: Secondary | ICD-10-CM | POA: Diagnosis not present

## 2019-06-08 ENCOUNTER — Other Ambulatory Visit: Payer: Self-pay | Admitting: Family Medicine

## 2019-08-18 ENCOUNTER — Other Ambulatory Visit: Payer: Self-pay | Admitting: Family Medicine

## 2019-08-18 DIAGNOSIS — E7800 Pure hypercholesterolemia, unspecified: Secondary | ICD-10-CM

## 2019-09-01 DIAGNOSIS — F3162 Bipolar disorder, current episode mixed, moderate: Secondary | ICD-10-CM | POA: Diagnosis not present

## 2019-09-01 DIAGNOSIS — F4310 Post-traumatic stress disorder, unspecified: Secondary | ICD-10-CM | POA: Diagnosis not present

## 2019-09-01 DIAGNOSIS — F5101 Primary insomnia: Secondary | ICD-10-CM | POA: Diagnosis not present

## 2019-09-08 DIAGNOSIS — H40033 Anatomical narrow angle, bilateral: Secondary | ICD-10-CM | POA: Diagnosis not present

## 2019-09-08 DIAGNOSIS — H2513 Age-related nuclear cataract, bilateral: Secondary | ICD-10-CM | POA: Diagnosis not present

## 2019-09-25 DIAGNOSIS — H5213 Myopia, bilateral: Secondary | ICD-10-CM | POA: Diagnosis not present

## 2019-10-04 ENCOUNTER — Other Ambulatory Visit: Payer: Self-pay

## 2019-10-04 ENCOUNTER — Ambulatory Visit (INDEPENDENT_AMBULATORY_CARE_PROVIDER_SITE_OTHER): Payer: Medicaid Other | Admitting: Family Medicine

## 2019-10-04 ENCOUNTER — Encounter: Payer: Self-pay | Admitting: Family Medicine

## 2019-10-04 VITALS — BP 120/64 | HR 70 | Temp 96.6°F | Resp 14 | Ht 62.0 in | Wt 117.0 lb

## 2019-10-04 DIAGNOSIS — E7800 Pure hypercholesterolemia, unspecified: Secondary | ICD-10-CM

## 2019-10-04 DIAGNOSIS — R634 Abnormal weight loss: Secondary | ICD-10-CM

## 2019-10-04 DIAGNOSIS — Z8669 Personal history of other diseases of the nervous system and sense organs: Secondary | ICD-10-CM | POA: Diagnosis not present

## 2019-10-04 NOTE — Progress Notes (Signed)
Subjective:    Patient ID: Natalie [name], female    DOB: 14-Aug-1960, 59 y.o.   MRN: [id number]  HPI  08/07/18 Patient presents today complaining of a right-sided headache off-and-on for almost 1 year.  Over the last 6 months it has become chronic and daily.  It is located just anterior to the right ear.  It radiates behind the right eye.  It is pulsatile.  It is associated with photophobia and phonophobia.  She will occasionally see blurry vision with a headache.  It can cause nausea but no vomiting.  She denies any scotoma or scintillating lines.  She does have a family history of migraines in both her brother and another relative.  She has never had chronic daily headaches before.  She denies any rhinorrhea or lacrimation.  She denies any head trauma.  She denies any neurologic deficit.  Neurologic exam today is completely normal.  There is no tenderness to palpation over the temporal artery.  Pain radiates in front of the ear up and around behind the ear and sometimes down into the right side of the neck.  She is taking Excedrin Migraine on a daily basis.  AT that time, my plan was: Differential diagnosis includes migraine, chronic tension headache, trigeminal neuralgia, cluster headache, temporal arteritis.  On her presentation the chronicity, I believe this is most likely migraines.  I recommended starting Topamax 25 mg daily for 1 week.  She will increase by 25 mg every week until she is eventually taking 50 mg twice daily in 1 month.  I would like to see the patient back in 1 month to reassess her headache frequency and severity or sooner if getting worse.  I recommended against taking Excedrin Migraine on a daily basis due to the possibility of rebound headache  09/03/18 Patient was only able to take Topamax for 1 week.  She reports feeling extremely dizzy.  Her balance was extremely compromised.  She felt nauseated.  Therefore she discontinued the medication after 7 days.  However she continues to  complain of severe unrelenting headaches that occur on a daily basis.  She is still taking Excedrin Migraine every day despite my recommendations to discontinue this due to caffeine withdrawal and rebound headache possibilities.  She continues to report light and sound sensitivity and nausea associated with a headache.  The headache continues to be located behind her right eye and is sharp and stabbing in nature.  At that time, my plan was: Discontinue Topamax.  Begin Aimovig 70 mg subcu every month.  Use Zanaflex 4 mg every 6 hours as needed for headache.  Discontinue Excedrin Migraine due to rebound headache.  Given the severity of the headache and the persistent nature of the headache despite conservative therapy, I will obtain an MRI of the brain to rule out space-occupying lesion behind the right.  Follow-up in 1 month.  10/04/19 Patient is here today for follow-up.  She states that since she started the [place] she has not had any further headaches!.  She is tolerating the medication well with no side effects.  She denies any nausea or vomiting.  She denies any migraines.  Previously she was having 3-4 every month at times that would last several days.  She is also stopped drinking caffeine.  She has lost more than 25 pounds since I last saw her.  She states that she has very little appetite.  She has been under more stress caring for her father who has end-stage dementia and  has been in and out of the hospital.  She believes this is affecting her appetite.  She denies any melena or hematochezia.  She is overdue for a colonoscopy.  She refuses a colonoscopy due to constraints of her time however she would consent to Cologuard.  She is also overdue for mammogram.  She declines to allow me to schedule a mammogram today but she will consider this in the future.  She is also due for fasting lab work to monitor her hyperlipidemia.  Past Medical History:  Diagnosis Date  . Allergy    seasonal  . Anxiety     bipolar  . Bipolar 1 disorder ([place])   . Depression   . Glaucoma    stage 2  . Hyperlipidemia   . Smoker    Past Surgical History:  Procedure Laterality Date  . [place]   face after accident  . TUBAL LIGATION  1993   Current Outpatient Medications on File Prior to Visit  Medication Sig Dispense Refill  . AIMOVIG 70 MG/ML SOAJ INJECT 70 MG INTO THE SKIN EVERY 30 (THIRTY) DAYS. 1 pen 3  . lamoTRIgine (LAMICTAL) 100 MG tablet Take 1 tablet (100 mg total) by mouth every morning. 30 tablet 0  . PARoxetine (PAXIL) 20 MG tablet Take 1 tablet (20 mg total) by mouth daily.    . pravastatin (PRAVACHOL) 40 MG tablet Take 1 tablet (40 mg total) by mouth every evening. 90 tablet 1  . QUEtiapine (SEROQUEL) 100 MG tablet Take 1 tablet (100 mg total) by mouth at bedtime.    [place] tiZANidine (ZANAFLEX) 4 MG tablet TAKE 1 TABLET BY MOUTH EVERY 6 HOURS AS NEEDED 30 tablet 2  . tretinoin (RETIN-A) 0.025 % cream Apply topically at bedtime. 45 g 5  . triamcinolone cream (KENALOG) 0.1 % APPLY 1 APPLICATION TOPICALLY 2 TIMES DAILY FOR 7 DAYS 45 g 0   No current facility-administered medications on file prior to visit.   Allergies  Allergen Reactions  . Sulfa Antibiotics Anaphylaxis and Swelling     flu like sx  . Aspirin Other (See Comments)    Has ulcers  . Codeine Nausea And Vomiting and Other (See Comments)    Stomach pain  . Hydrocodone Nausea And Vomiting  . Mobic [Meloxicam] Nausea And Vomiting  . Topamax [Topiramate] Other (See Comments)    Dizziness, Weird taste in mouth,sick feeling  . Tylenol [Acetaminophen] Other (See Comments)    Has ulcers   Social History   Socioeconomic History  . Marital status: Married    Spouse name: Not on file  . Number of children: Not on file  . Years of education: Not on file  . Highest education level: Not on file  Occupational History  . Not on file  Tobacco Use  . Smoking status: Current Every Day Smoker    Packs/day: 0.25    Years:  15.00    Pack years: 3.75    Types: Cigarettes  . Smokeless tobacco: Never Used  . Tobacco comment: quit 03/2014  Substance and Sexual Activity  . Alcohol use: No    Alcohol/week: 0.0 standard drinks  . Drug use: No  . Sexual activity: Not Currently  Other Topics Concern  . Not on file  Social History Narrative  . Not on file   Social Determinants of Health   Financial Resource Strain:   . Difficulty of Paying Living Expenses: Not on file  Food Insecurity:   . Worried About Estate manager/land agent  of Food in the Last Year: Not on file  . Ran Out of Food in the Last Year: Not on file  Transportation Needs:   . Lack of Transportation (Medical): Not on file  . Lack of Transportation (Non-Medical): Not on file  Physical Activity:   . Days of Exercise per Week: Not on file  . Minutes of Exercise per Session: Not on file  Stress:   . Feeling of Stress : Not on file  Social Connections:   . Frequency of Communication with Friends and Family: Not on file  . Frequency of Social Gatherings with Friends and Family: Not on file  . Attends Religious Services: Not on file  . Active Member of Clubs or Organizations: Not on file  . Attends Archivist Meetings: Not on file  . Marital Status: Not on file  Intimate Partner Violence:   . Fear of Current or Ex-Partner: Not on file  . Emotionally Abused: Not on file  . Physically Abused: Not on file  . Sexually Abused: Not on file      Review of Systems  All other systems reviewed and are negative.      Objective:   Physical Exam Vitals reviewed.  Constitutional:      General: She is not in acute distress.    Appearance: She is well-developed and normal weight. She is not ill-appearing, toxic-appearing or diaphoretic.  HENT:     Head: Normocephalic and atraumatic.  Eyes:     General: No visual field deficit or scleral icterus.    Extraocular Movements: Extraocular movements intact.     Right eye: Normal extraocular motion and no  nystagmus.     Left eye: Normal extraocular motion.     Pupils: Pupils are equal, round, and reactive to light. Pupils are equal.     Right eye: Pupil is round and reactive.     Left eye: Pupil is round and reactive.  Cardiovascular:     Rate and Rhythm: Normal rate and regular rhythm.     Heart sounds: Normal heart sounds. No murmur. No friction rub. No gallop.   Pulmonary:     Effort: Pulmonary effort is normal. No respiratory distress.     Breath sounds: Normal breath sounds. No stridor. No wheezing or rales.  Abdominal:     General: Bowel sounds are normal.     Palpations: Abdomen is soft.  Musculoskeletal:     Cervical back: Normal range of motion and neck supple.  Neurological:     Mental Status: She is alert.     Cranial Nerves: No cranial nerve deficit, dysarthria or facial asymmetry.     Sensory: No sensory deficit.     Motor: No weakness.     Coordination: Romberg sign negative. Coordination normal.     Gait: Gait normal.  Psychiatric:        Mood and Affect: Mood normal.        Speech: Speech normal.        Behavior: Behavior normal.           Assessment & Plan:  Pure hypercholesterolemia - Plan: CBC with Differential/Platelet, COMPLETE METABOLIC PANEL WITH GFR, Lipid panel  Weight loss - Plan: TSH  History of migraine  I will schedule patient for Cologuard to evaluate for possible colon cancer.  I have recommended a mammogram but she declines this today.  I will check a CBC, CMP, and fasting lipid panel to monitor her hyperlipidemia particular given the fact that she  is on Seroquel under the care of her psychiatrist for bipolar disorder.  Given the weight loss I will also check a TSH.  I will be glad to schedule her mammogram whenever she request me to do so.

## 2019-10-06 ENCOUNTER — Telehealth: Payer: Self-pay | Admitting: *Deleted

## 2019-10-06 NOTE — Telephone Encounter (Signed)
-----   Message from Alyson Locket, [place] sent at 10/04/2019  2:45 PM EST -----  ----- Message ----- From: Susy Frizzle, MD Sent: 10/04/2019   2:27 PM EST To: Alyson Locket, RMA  Please schedule cologuard

## 2019-10-06 NOTE — Telephone Encounter (Signed)
Received verbal orders for Cologuard.   Order placed via [facility].   Cologuard (Order [id number])

## 2019-10-10 ENCOUNTER — Other Ambulatory Visit: Payer: Self-pay

## 2019-10-10 ENCOUNTER — Other Ambulatory Visit: Payer: Medicaid Other

## 2019-10-11 ENCOUNTER — Other Ambulatory Visit: Payer: Self-pay

## 2019-10-11 DIAGNOSIS — E7800 Pure hypercholesterolemia, unspecified: Secondary | ICD-10-CM

## 2019-10-11 LAB — CBC WITH DIFFERENTIAL/PLATELET
Absolute Monocytes: 432 cells/uL (ref 200–950)
Basophils Absolute: 40 cells/uL (ref 0–200)
Basophils Relative: 0.5 %
Eosinophils Absolute: 320 cells/uL (ref 15–500)
Eosinophils Relative: 4 %
HCT: 42.2 % (ref 35.0–45.0)
Hemoglobin: 14.3 g/dL (ref 11.7–15.5)
Lymphs Abs: 2896 cells/uL (ref 850–3900)
MCH: 32.4 pg (ref 27.0–33.0)
MCHC: 33.9 g/dL (ref 32.0–36.0)
MCV: 95.7 fL (ref 80.0–100.0)
MPV: 9.7 fL (ref 7.5–12.5)
Monocytes Relative: 5.4 %
Neutro Abs: 4312 cells/uL (ref 1500–7800)
Neutrophils Relative %: 53.9 %
Platelets: 225 10*3/uL (ref 140–400)
RBC: 4.41 10*6/uL (ref 3.80–5.10)
RDW: 13.2 % (ref 11.0–15.0)
Total Lymphocyte: 36.2 %
WBC: 8 10*3/uL (ref 3.8–10.8)

## 2019-10-11 LAB — COMPLETE METABOLIC PANEL WITH GFR
AG Ratio: 1.9 (calc) (ref 1.0–2.5)
ALT: 19 U/L (ref 6–29)
AST: 20 U/L (ref 10–35)
Albumin: 4.5 g/dL (ref 3.6–5.1)
Alkaline phosphatase (APISO): 69 U/L (ref 37–153)
BUN: 8 mg/dL (ref 7–25)
CO2: 28 mmol/L (ref 20–32)
Calcium: 9.5 mg/dL (ref 8.6–10.4)
Chloride: 101 mmol/L (ref 98–110)
Creat: 0.83 mg/dL (ref 0.50–0.99)
GFR, Est African American: 88 mL/min/{1.73_m2} (ref >=60)
GFR, Est Non African American: 76 mL/min/{1.73_m2} (ref >=60)
Globulin: 2.4 g/dL (calc) (ref 1.9–3.7)
Glucose, Bld: 91 mg/dL (ref 65–99)
Potassium: 4.3 mmol/L (ref 3.5–5.3)
Sodium: 138 mmol/L (ref 135–146)
Total Bilirubin: 0.4 mg/dL (ref 0.2–1.2)
Total Protein: 6.9 g/dL (ref 6.1–8.1)

## 2019-10-11 LAB — LIPID PANEL
Cholesterol: 231 mg/dL — ABNORMAL HIGH (ref <200)
HDL: 69 mg/dL (ref >=50)
LDL Cholesterol (Calc): 137 mg/dL (calc) — ABNORMAL HIGH
Non-HDL Cholesterol (Calc): 162 mg/dL (calc) — ABNORMAL HIGH (ref <130)
Total CHOL/HDL Ratio: 3.3 (calc) (ref <5.0)
Triglycerides: 123 mg/dL (ref <150)

## 2019-10-11 LAB — TSH: TSH: 1.4 mIU/L (ref 0.40–4.50)

## 2019-10-11 MED ORDER — PRAVASTATIN SODIUM 40 MG PO TABS: 40.0000 mg | ORAL_TABLET | Freq: Every evening | ORAL | 3 refills | Status: DC

## 2019-10-25 LAB — COLOGUARD

## 2019-11-01 NOTE — Telephone Encounter (Signed)
Received the results of Cologuard screening.   Screening noted negative.   A negative result indicates a low likelihood of colorectal cancer is present. Following a negative Cologuard result, the [employer] Society recommends a Cologuard re-screening interval of 3 years.   Letter sent.   

## 2019-11-08 ENCOUNTER — Other Ambulatory Visit: Payer: Self-pay | Admitting: Family Medicine

## 2019-11-14 DIAGNOSIS — F3162 Bipolar disorder, current episode mixed, moderate: Secondary | ICD-10-CM | POA: Diagnosis not present

## 2019-11-14 DIAGNOSIS — F5101 Primary insomnia: Secondary | ICD-10-CM | POA: Diagnosis not present

## 2019-11-14 DIAGNOSIS — F4310 Post-traumatic stress disorder, unspecified: Secondary | ICD-10-CM | POA: Diagnosis not present

## 2019-12-02 DIAGNOSIS — H43393 Other vitreous opacities, bilateral: Secondary | ICD-10-CM | POA: Diagnosis not present

## 2019-12-02 DIAGNOSIS — H5203 Hypermetropia, bilateral: Secondary | ICD-10-CM | POA: Diagnosis not present

## 2019-12-06 NOTE — Progress Notes (Addendum)
[facility] Note  12/07/2019     CHIEF COMPLAINT Patient presents for Retina Evaluation   HISTORY OF PRESENT ILLNESS: Natalie [name] is a 59 y.o. female who presents to the clinic today for:   HPI    Retina Evaluation    In both eyes.  This started 1 year ago.  Duration of 1 year.  Associated Symptoms Floaters.  Negative for Pain.  I, the attending physician,  performed the HPI with the patient and updated documentation appropriately.          Comments    Patient here for retinal evaluation referred by Dr Marin Comment. Patient states vision sees spots everywhere. Can't judge in the road. Has big black floaters no eye pain. Patient had lide surgery done and feels there's scarring causing the black floaters.       Last edited by Bernarda Caffey, MD on 12/10/2019  1:26 AM. (History)    pt is here on the referral of Dr. Marin Comment for concern of floaters OU, pt states she has floaters in both eyes, but mainly the right, she states she had eyelid sx a couple years ago (Dr. Kristeen Miss) and thinks this might be the cause of the floaters, she states she knows she has cataracts, but they aren't "ripe" yet, she states she may have  Referring physician: Marin Comment [address] [facility], [place] [place],  [place] [id number]  HISTORICAL INFORMATION:   Selected notes from the [facility]: No current outpatient medications on file. (Ophthalmic Drugs)   No current facility-administered medications for this visit. (Ophthalmic Drugs)   Current Outpatient Medications (Other)  Medication Sig  . AIMOVIG 70 MG/ML SOAJ INJECT 70 MG INTO THE SKIN EVERY 30 (THIRTY) DAYS.  Marland Kitchen lamoTRIgine (LAMICTAL) 100 MG tablet Take 1 tablet (100 mg total) by mouth every morning.  Marland Kitchen PARoxetine (PAXIL) 20 MG tablet Take 1 tablet (20 mg total) by mouth daily.  . pravastatin (PRAVACHOL) 40 MG tablet Take 1 tablet (40 mg total) by mouth every evening.  Marland Kitchen QUEtiapine (SEROQUEL) 100 MG tablet Take 1  tablet (100 mg total) by mouth at bedtime.  . triamcinolone cream (KENALOG) 0.1 % APPLY 1 APPLICATION TOPICALLY 2 TIMES DAILY FOR 7 DAYS   No current facility-administered medications for this visit. (Other)      REVIEW OF SYSTEMS: ROS    Positive for: Eyes   Last edited by Theodore Demark, COA on 12/07/2019  2:13 PM. (History)       ALLERGIES Allergies  Allergen Reactions  . Sulfa Antibiotics Anaphylaxis and Swelling     flu like sx  . Aspirin Other (See Comments)    Has ulcers  . Codeine Nausea And Vomiting and Other (See Comments)    Stomach pain  . Hydrocodone Nausea And Vomiting  . Mobic [Meloxicam] Nausea And Vomiting  . Topamax [Topiramate] Other (See Comments)    Dizziness, Weird taste in mouth,sick feeling  . Tylenol [Acetaminophen] Other (See Comments)    Has ulcers    PAST MEDICAL HISTORY Past Medical History:  Diagnosis Date  . Allergy    seasonal  . Anxiety    bipolar  . Bipolar 1 disorder ([place])   . Depression   . Glaucoma    stage 2  . Hyperlipidemia   . Smoker    Past Surgical History:  Procedure Laterality Date  . [place]   face after accident  . TUBAL LIGATION  1993    FAMILY HISTORY Family History  Problem Relation Age of Onset  . Hypertension Father   . Hyperlipidemia Father   . Colon cancer Maternal Grandfather   . Cancer Neg Hx   . Heart disease Neg Hx     SOCIAL HISTORY Social History   Tobacco Use  . Smoking status: Current Every Day Smoker    Packs/day: 0.25    Years: 15.00    Pack years: 3.75    Types: Cigarettes  . Smokeless tobacco: Never Used  . Tobacco comment: quit 03/2014  Substance Use Topics  . Alcohol use: No    Alcohol/week: 0.0 standard drinks  . Drug use: No         OPHTHALMIC EXAM:  Base Eye Exam    Visual Acuity (Snellen - Linear)      Right Left   Dist cc 20/30 +1 20/20 -1   Dist ph cc 20/20 -2    Correction: Glasses  Patient wanted  Vision tested without glasses.  [place] OD  25-2 and ph NI ; OS 30 and ph NI.       Tonometry (Tonopen, 2:24 PM)      Right Left   Pressure 20 19       Pupils      Dark Light Shape React APD   Right 4 3 Round Brisk None   Left 4 3 Round Brisk None       Visual Fields (Counting fingers)      Left Right    Full Full       Extraocular Movement      Right Left    Full, Ortho Full, Ortho       Neuro/Psych    Oriented x3: Yes   Mood/Affect: Normal       Dilation    Both eyes: 1.0% Mydriacyl, 2.5% Phenylephrine @ 2:24 PM        Slit Lamp and Fundus Exam    Slit Lamp Exam      Right Left   Lids/Lashes Dermatochalasis - upper lid Dermatochalasis - upper lid   Conjunctiva/Sclera Mild nasal Pinguecula Mild nasal Pinguecula   Cornea Mild Debris in tear film, mild Krukenberg's spindle clear, mild Krukenberg's spindle   Anterior Chamber Deep and quiet Deep and quiet   Iris Round and dilated Round and dilated   Lens 1-2+ Nuclear sclerosis, 1-2+ Cortical cataract 1-2+ Nuclear sclerosis, 1-2+ Cortical cataract   Vitreous Vitreous syneresis, Posterior vitreous detachment Vitreous syneresis, Posterior vitreous detachment, vitreous condensations       Fundus Exam      Right Left   Disc Pink and Sharp Pink and Sharp   C/D Ratio 0.5 0.4   Macula Flat, Good foveal reflex, mild Retinal pigment epithelial mottling, No heme or edema Flat, Good foveal reflex, mild Retinal pigment epithelial mottling, No heme or edema   Vessels Mild Vascular attenuation, mild Tortuousity Mild Vascular attenuation, mild Tortuousity   Periphery Attached, peripheral cystoid degeneration at 0600, No heme, No RT/RD Attached, peripheral cystoid degeneration at 0600No heme, No RT/RD        Refraction    Wearing Rx      Sphere Cylinder Axis Add   Right -0.50 +0.50 173 +2.25   Left -1.00 +0.50 166 +2.25       Manifest Refraction      Sphere Cylinder Axis Dist VA   Right [place] +0.50 171 20/25-2   Left -1.00 +0.75 165 20/20  IMAGING  AND PROCEDURES  Imaging and Procedures for @TODAY @  OCT, Retina - OU - Both Eyes       Right Eye Quality was good. Central Foveal Thickness: 262. Progression has no prior data. Findings include normal foveal contour, no IRF, no SRF.   Left Eye Quality was good. Central Foveal Thickness: 251. Progression has no prior data. Findings include normal foveal contour, no SRF, no IRF.   Notes *Images captured and stored on drive  Diagnosis / Impression:  NFP, no IRF/SRF OU  Clinical management:  See below  Abbreviations: NFP - Normal foveal profile. CME - cystoid macular edema. PED - pigment epithelial detachment. IRF - intraretinal fluid. SRF - subretinal fluid. EZ - ellipsoid zone. ERM - epiretinal membrane. ORA - outer retinal atrophy. ORT - outer retinal tubulation. SRHM - subretinal hyper-reflective material                 ASSESSMENT/PLAN:    ICD-10-CM   1. Posterior vitreous detachment of both eyes  H43.813   2. Retinal edema  H35.81 OCT, Retina - OU - Both Eyes  3. Essential hypertension  I10   4. Hypertensive retinopathy of both eyes  H35.033   5. Combined forms of age-related cataract of both eyes  H25.813     1,2. PVD / vitreous syneresis OU  - symptomatic floaters OU (OD > OS)  - Discussed findings and prognosis  - No RT or RD on 360 scleral depressed exam  - Reviewed s/s of RT/RD  - Strict return precautions for any such RT/RD signs/symptoms  - f/u here prn  3,4. Hypertensive retinopathy OU  - discussed importance of tight BP control  - monitor  5. Mixed form age related cataracts OU  - The symptoms of cataract, surgical options, and treatments and risks were discussed with patient.  - discussed diagnosis and progression  - not yet visually significant  - monitor for now    Ophthalmic Meds Ordered this visit:  No orders of the defined types were placed in this encounter.      Return if symptoms worsen or fail to improve.  There are no  Patient Instructions on file for this visit.   Explained the diagnoses, plan, and follow up with the patient and they expressed understanding.  Patient expressed understanding of the importance of proper follow up care.    This document serves as a record of services personally performed by Gardiner Sleeper, MD, PhD. It was created on their behalf by Ernest Mallick, OA, an ophthalmic assistant. The creation of this record is the provider's dictation and/or activities during the visit.    Electronically signed by: Ernest Mallick, OA 05.05.2021 1:32 AM  Gardiner Sleeper, M.D., Ph.D. Diseases & Surgery of the Retina and [place] 12/07/2019   I have reviewed the above documentation for accuracy and completeness, and I agree with the above. Gardiner Sleeper, M.D., Ph.D. 12/10/19 1:32 AM   Abbreviations: M myopia (nearsighted); A astigmatism; H hyperopia (farsighted); P presbyopia; Mrx spectacle prescription;  CTL contact lenses; OD right eye; OS left eye; OU both eyes  XT exotropia; ET esotropia; PEK punctate epithelial keratitis; PEE punctate epithelial erosions; DES dry eye syndrome; MGD meibomian gland dysfunction; ATs artificial tears; PFAT's preservative free artificial tears; [place] nuclear sclerotic cataract; PSC posterior subcapsular cataract; ERM epi-retinal membrane; PVD posterior vitreous detachment; RD retinal detachment; DM diabetes mellitus; DR diabetic retinopathy; NPDR non-proliferative diabetic retinopathy; PDR proliferative diabetic retinopathy;  CSME clinically significant macular edema; DME diabetic macular edema; dbh dot blot hemorrhages; CWS cotton wool spot; POAG primary open angle glaucoma; C/D cup-to-disc ratio; HVF humphrey visual field; GVF goldmann visual field; OCT optical coherence tomography; IOP intraocular pressure; BRVO Branch retinal vein occlusion; CRVO central retinal vein occlusion; CRAO central retinal artery occlusion; BRAO branch retinal  artery occlusion; RT retinal tear; SB scleral buckle; PPV pars plana vitrectomy; VH Vitreous hemorrhage; PRP panretinal laser photocoagulation; IVK intravitreal kenalog; VMT vitreomacular traction; MH Macular hole;  NVD neovascularization of the disc; NVE neovascularization elsewhere; AREDS age related eye disease study; ARMD age related macular degeneration; POAG primary open angle glaucoma; EBMD epithelial/anterior basement membrane dystrophy; ACIOL anterior chamber intraocular lens; IOL intraocular lens; PCIOL posterior chamber intraocular lens; Phaco/IOL phacoemulsification with intraocular lens placement; [place] photorefractive keratectomy; LASIK laser assisted in situ keratomileusis; HTN hypertension; DM diabetes mellitus; COPD chronic obstructive pulmonary disease

## 2019-12-07 ENCOUNTER — Encounter (INDEPENDENT_AMBULATORY_CARE_PROVIDER_SITE_OTHER): Payer: Self-pay | Admitting: Ophthalmology

## 2019-12-07 ENCOUNTER — Ambulatory Visit (INDEPENDENT_AMBULATORY_CARE_PROVIDER_SITE_OTHER): Payer: Medicaid Other | Admitting: Ophthalmology

## 2019-12-07 DIAGNOSIS — H35033 Hypertensive retinopathy, bilateral: Secondary | ICD-10-CM | POA: Diagnosis not present

## 2019-12-07 DIAGNOSIS — H3581 Retinal edema: Secondary | ICD-10-CM

## 2019-12-07 DIAGNOSIS — I10 Essential (primary) hypertension: Secondary | ICD-10-CM | POA: Diagnosis not present

## 2019-12-07 DIAGNOSIS — H43813 Vitreous degeneration, bilateral: Secondary | ICD-10-CM | POA: Diagnosis not present

## 2019-12-07 DIAGNOSIS — H25813 Combined forms of age-related cataract, bilateral: Secondary | ICD-10-CM | POA: Diagnosis not present

## 2019-12-08 DIAGNOSIS — Z23 Encounter for immunization: Secondary | ICD-10-CM | POA: Diagnosis not present

## 2019-12-10 ENCOUNTER — Encounter (INDEPENDENT_AMBULATORY_CARE_PROVIDER_SITE_OTHER): Payer: Self-pay | Admitting: Ophthalmology

## 2019-12-29 DIAGNOSIS — Z23 Encounter for immunization: Secondary | ICD-10-CM | POA: Diagnosis not present

## 2020-01-03 DIAGNOSIS — F3162 Bipolar disorder, current episode mixed, moderate: Secondary | ICD-10-CM | POA: Diagnosis not present

## 2020-01-03 DIAGNOSIS — F4310 Post-traumatic stress disorder, unspecified: Secondary | ICD-10-CM | POA: Diagnosis not present

## 2020-01-03 DIAGNOSIS — F5101 Primary insomnia: Secondary | ICD-10-CM | POA: Diagnosis not present

## 2020-03-15 ENCOUNTER — Other Ambulatory Visit: Payer: Self-pay | Admitting: Family Medicine

## 2020-03-20 DIAGNOSIS — F5101 Primary insomnia: Secondary | ICD-10-CM | POA: Diagnosis not present

## 2020-03-20 DIAGNOSIS — F4310 Post-traumatic stress disorder, unspecified: Secondary | ICD-10-CM | POA: Diagnosis not present

## 2020-03-20 DIAGNOSIS — F3162 Bipolar disorder, current episode mixed, moderate: Secondary | ICD-10-CM | POA: Diagnosis not present

## 2020-04-27 ENCOUNTER — Telehealth: Payer: Self-pay | Admitting: *Deleted

## 2020-04-27 MED ORDER — AIMOVIG 70 MG/ML ~~LOC~~ SOAJ: SUBCUTANEOUS | 3 refills | Status: DC

## 2020-04-27 NOTE — Telephone Encounter (Signed)
Received request from pharmacy for [facility] [address].   PA submitted.   Dx: P73.750- [id number].  Received immediate PA determination.   PA Case [id number] approved 04/27/2020- 04/27/2021.  Pharmacy made aware.

## 2020-05-29 DIAGNOSIS — F5101 Primary insomnia: Secondary | ICD-10-CM | POA: Diagnosis not present

## 2020-05-29 DIAGNOSIS — F4310 Post-traumatic stress disorder, unspecified: Secondary | ICD-10-CM | POA: Diagnosis not present

## 2020-05-29 DIAGNOSIS — F3162 Bipolar disorder, current episode mixed, moderate: Secondary | ICD-10-CM | POA: Diagnosis not present

## 2020-06-22 ENCOUNTER — Ambulatory Visit: Payer: Medicaid Other | Admitting: Family Medicine

## 2020-07-04 ENCOUNTER — Encounter: Payer: Self-pay | Admitting: Family Medicine

## 2020-07-04 ENCOUNTER — Other Ambulatory Visit: Payer: Self-pay

## 2020-07-04 ENCOUNTER — Ambulatory Visit: Payer: Medicaid Other | Admitting: Family Medicine

## 2020-07-04 VITALS — BP 112/68 | HR 80 | Temp 97.9°F | Resp 18 | Ht 62.0 in | Wt 125.0 lb

## 2020-07-04 DIAGNOSIS — Z1231 Encounter for screening mammogram for malignant neoplasm of breast: Secondary | ICD-10-CM | POA: Diagnosis not present

## 2020-07-04 DIAGNOSIS — R8290 Unspecified abnormal findings in urine: Secondary | ICD-10-CM | POA: Diagnosis not present

## 2020-07-04 DIAGNOSIS — R591 Generalized enlarged lymph nodes: Secondary | ICD-10-CM

## 2020-07-04 DIAGNOSIS — Z113 Encounter for screening for infections with a predominantly sexual mode of transmission: Secondary | ICD-10-CM

## 2020-07-04 LAB — URINALYSIS, ROUTINE W REFLEX MICROSCOPIC
Bilirubin Urine: NEGATIVE
Glucose, UA: NEGATIVE
Hgb urine dipstick: NEGATIVE
Ketones, ur: NEGATIVE
Leukocytes,Ua: NEGATIVE
Nitrite: NEGATIVE
Protein, ur: NEGATIVE
Specific Gravity, Urine: 1.015 (ref 1.001–1.03)
pH: 7.5 (ref 5.0–8.0)

## 2020-07-04 NOTE — Patient Instructions (Addendum)
Schedule mammogram  We will call with results Schedule a physical W/pap  with Dr. Dennard Schaumann

## 2020-07-04 NOTE — Progress Notes (Signed)
   Subjective:    Patient ID: Natalie [name], female    DOB: 1960/09/08, 59 y.o.   MRN: [id number]  Patient presents for [facility] Lymph Nodes (has gotten better- swollen in neck and under axilla) and Malodorous Urine   Pt here with malodorous urine, she was drinking more soda, she is now drinking more water   She noted intermittant glands in neck and in axilla on and off. No fever, no cough and congestion   There is a wood stove in her parents home she was doing a lot for her family had some back pain but that has improved, she was not sure if that was related to glands being swollen although now they are down??   Flu shot and covid shot done    Bipolar- reviewed meds, states she is now stable and wants to proceed with getting her appt in and preventative health on track   Meds reviewed  Review Of Systems:  GEN- denies fatigue, fever, weight loss,weakness, recent illness HEENT- denies eye drainage, change in vision, nasal discharge, CVS- denies chest pain, palpitations RESP- denies SOB, cough, wheeze ABD- denies N/V, change in stools, abd pain GU- denies dysuria, hematuria, dribbling, incontinence MSK- denies joint pain, muscle aches, injury Neuro- denies headache, dizziness, syncope, seizure activity       Objective:    BP 112/68   Pulse 80   Temp 97.9 F (36.6 C) (Temporal)   Resp 18   Ht 5\' 2"  (1.575 m)   Wt 125 lb (56.7 kg)   LMP 11/17/2010   SpO2 94%   BMI 22.86 kg/m  GEN- NAD, alert and oriented x3 HEENT- PERRL, EOMI, non injected sclera, pink conjunctiva, MMM, oropharynx clear Neck- Supple, no thyromegaly CVS- RRR, no murmur RESP-CTAB LYMPH- very small shotty right post auricular node, NT, no other nodes cervical region, no axilla nodes or cyst/boils noted  ABD-NABS,soft,NT,ND, no CVA tenderness  Psych pleasant affect and mood  EXT- No edema Pulses- Radial, DP- 2+        Assessment & Plan:      Problem List Items Addressed This Visit    None    Visit  Diagnoses    Malodorous urine    -  Primary   Urine is clear, will send for culture she asked for STD screening based on ex husbands lifestyle, no new partners, no vaginal symptoms    Relevant Orders   Urinalysis, Routine w reflex microscopic (Completed)   Urine Culture   Lymphadenopathy       now resolved, ? reactive, she is due for mammo and will f/u for PAP , check CBC   Relevant Orders   CBC with Differential/Platelet (Completed)   Comprehensive metabolic panel   TSH   Encounter for screening mammogram for malignant neoplasm of breast       Relevant Orders   MM 3D SCREEN BREAST BILATERAL   Screen for STD (sexually transmitted disease)       Relevant Orders   HIV Antibody (routine testing w rflx)   RPR   Hepatitis C antibody      Note: This dictation was prepared with Dragon dictation along with smaller phrase technology. Any transcriptional errors that result from this process are unintentional.

## 2020-07-05 ENCOUNTER — Telehealth: Payer: Self-pay

## 2020-07-05 ENCOUNTER — Telehealth: Payer: Self-pay | Admitting: Family Medicine

## 2020-07-05 ENCOUNTER — Other Ambulatory Visit: Payer: Self-pay | Admitting: Family Medicine

## 2020-07-05 LAB — COMPREHENSIVE METABOLIC PANEL
AG Ratio: 1.8 (calc) (ref 1.0–2.5)
ALT: 35 U/L — ABNORMAL HIGH (ref 6–29)
AST: 40 U/L — ABNORMAL HIGH (ref 10–35)
Albumin: 4.3 g/dL (ref 3.6–5.1)
Alkaline phosphatase (APISO): 68 U/L (ref 37–153)
BUN: 10 mg/dL (ref 7–25)
CO2: 27 mmol/L (ref 20–32)
Calcium: 9 mg/dL (ref 8.6–10.4)
Chloride: 102 mmol/L (ref 98–110)
Creat: 0.81 mg/dL (ref 0.50–0.99)
Globulin: 2.4 g/dL (calc) (ref 1.9–3.7)
Glucose, Bld: 79 mg/dL (ref 65–99)
Potassium: 4.4 mmol/L (ref 3.5–5.3)
Sodium: 138 mmol/L (ref 135–146)
Total Bilirubin: 0.3 mg/dL (ref 0.2–1.2)
Total Protein: 6.7 g/dL (ref 6.1–8.1)

## 2020-07-05 LAB — CBC WITH DIFFERENTIAL/PLATELET
Absolute Monocytes: 301 cells/uL (ref 200–950)
Basophils Absolute: 28 cells/uL (ref 0–200)
Basophils Relative: 0.4 %
Eosinophils Absolute: 301 cells/uL (ref 15–500)
Eosinophils Relative: 4.3 %
HCT: 38.8 % (ref 35.0–45.0)
Hemoglobin: 13.2 g/dL (ref 11.7–15.5)
Lymphs Abs: 2135 cells/uL (ref 850–3900)
MCH: 32.4 pg (ref 27.0–33.0)
MCHC: 34 g/dL (ref 32.0–36.0)
MCV: 95.3 fL (ref 80.0–100.0)
MPV: 10 fL (ref 7.5–12.5)
Monocytes Relative: 4.3 %
Neutro Abs: 4235 cells/uL (ref 1500–7800)
Neutrophils Relative %: 60.5 %
Platelets: 198 10*3/uL (ref 140–400)
RBC: 4.07 10*6/uL (ref 3.80–5.10)
RDW: 12.6 % (ref 11.0–15.0)
Total Lymphocyte: 30.5 %
WBC: 7 10*3/uL (ref 3.8–10.8)

## 2020-07-05 LAB — URINE CULTURE
MICRO NUMBER:: 11263263
Result:: NO GROWTH
SPECIMEN QUALITY:: ADEQUATE

## 2020-07-05 LAB — RPR: RPR Ser Ql: NONREACTIVE

## 2020-07-05 LAB — HEPATITIS C ANTIBODY
Hepatitis C Ab: NONREACTIVE
SIGNAL TO CUT-OFF: 0.01 (ref <1.00)

## 2020-07-05 LAB — HIV ANTIBODY (ROUTINE TESTING W REFLEX): HIV 1&2 Ab, 4th Generation: NONREACTIVE

## 2020-07-05 LAB — TSH: TSH: 0.99 mIU/L (ref 0.40–4.50)

## 2020-07-05 MED ORDER — AMOXICILLIN 875 MG PO TABS: 875.0000 mg | ORAL_TABLET | Freq: Two times a day (BID) | ORAL | 0 refills | Status: DC

## 2020-07-05 NOTE — Telephone Encounter (Signed)
Was seen couple days ago can  Antibody  be call in for her pain not able to see her dentist

## 2020-07-05 NOTE — Telephone Encounter (Signed)
I sent amoxicillin to her pharmacy

## 2020-07-05 NOTE — Telephone Encounter (Signed)
Natalie [name] has appt to see Dr. Buelah Manis 07/11/20 @ 2pm

## 2020-07-05 NOTE — Telephone Encounter (Signed)
Natalie [name], called she thinks her tooth is infected, she's not able to get a dentist  appt until the 1st of the year. She's asking for antibiotic. Will schedule her appt.

## 2020-07-06 NOTE — Telephone Encounter (Signed)
Patient has appointment to see her Providers partner 07-12-20

## 2020-07-11 ENCOUNTER — Ambulatory Visit: Payer: Medicaid Other | Admitting: Family Medicine

## 2020-07-11 ENCOUNTER — Encounter: Payer: Self-pay | Admitting: Family Medicine

## 2020-07-11 ENCOUNTER — Other Ambulatory Visit: Payer: Self-pay

## 2020-07-11 ENCOUNTER — Telehealth: Payer: Self-pay | Admitting: Family Medicine

## 2020-07-11 VITALS — BP 110/66 | HR 60 | Temp 97.7°F | Resp 14 | Ht 62.0 in | Wt 125.0 lb

## 2020-07-11 DIAGNOSIS — Z01411 Encounter for gynecological examination (general) (routine) with abnormal findings: Secondary | ICD-10-CM | POA: Diagnosis not present

## 2020-07-11 DIAGNOSIS — E785 Hyperlipidemia, unspecified: Secondary | ICD-10-CM

## 2020-07-11 DIAGNOSIS — R7989 Other specified abnormal findings of blood chemistry: Secondary | ICD-10-CM

## 2020-07-11 DIAGNOSIS — R195 Other fecal abnormalities: Secondary | ICD-10-CM

## 2020-07-11 DIAGNOSIS — Z86010 Personal history of colonic polyps: Secondary | ICD-10-CM

## 2020-07-11 DIAGNOSIS — Z124 Encounter for screening for malignant neoplasm of cervix: Secondary | ICD-10-CM | POA: Diagnosis not present

## 2020-07-11 DIAGNOSIS — Z01419 Encounter for gynecological examination (general) (routine) without abnormal findings: Secondary | ICD-10-CM

## 2020-07-11 DIAGNOSIS — Z113 Encounter for screening for infections with a predominantly sexual mode of transmission: Secondary | ICD-10-CM

## 2020-07-11 LAB — WET PREP FOR TRICH, YEAST, CLUE

## 2020-07-11 NOTE — Telephone Encounter (Signed)
Call pt,   I reviewed her chart, she has history of colon polyps back in 2012, recommended she have a repeat scope in 5 years but I dont see where that was done She did do cologuard but on todays exam, her fecal occult was positive for blood I recommend repeating colonoscopy I have placed referral to GI

## 2020-07-11 NOTE — Progress Notes (Signed)
   Subjective:    Patient ID: Natalie [name], female    DOB: 15-Apr-1961, 59 y.o.   MRN: [id number]  Patient presents for Gynecologic Exam     Pt here for GYN exam   Due for PAP Smear  She had visit 1 week ago, labs done and mammo ordered  She had mildly elevated LFT, advised to repeat in 4 weeks with fasting lipid panel, she will return 1st week of Jan for this    Cologuard negative  , but colonoscopy from 2012 she had 2 polyps recommended repeat colonoscopy   Currently on Amoxicillin for for a tooth infection    No vaginal bleeding    Review Of Systems:  GEN- denies fatigue, fever, weight loss,weakness, recent illness HEENT- denies eye drainage, change in vision, nasal discharge, CVS- denies chest pain, palpitations RESP- denies SOB, cough, wheeze ABD- denies N/V, change in stools, abd pain GU- denies dysuria, hematuria, dribbling, incontinence MSK- denies joint pain, muscle aches, injury Neuro- denies headache, dizziness, syncope, seizure activity       Objective:    BP 110/66   Pulse 60   Temp 97.7 F (36.5 C) (Temporal)   Resp 14   Ht 5\' 2"  (1.575 m)   Wt 125 lb (56.7 kg)   LMP 11/17/2010   SpO2 100%   BMI 22.86 kg/m  GEN- NAD, alert and oriented x3 Breast- normal symmetry, no nipple inversion,no nipple drainage, no nodules or lumps felt Nodes- no axillary nodes GU- normal external genitalia, vaginal mucosa pink and moist, cervix visualized no growth, no blood form os, minimal thin clear discharge, no CMT, no ovarian masses, uterus normal size Rectum normal Tone, FOBT positive  EXT- No edema Pulses- Radial  2+        Assessment & Plan:      Problem List Items Addressed This Visit    None    Visit Diagnoses    Fecal occult blood test positive    -  Primary   sort of conumdrum, had polyps in past  on colonoscopy ,neg cologuard this year. with history recommend repeat colonoscopy as GI recommended repeat scope in 15yr   Gynecologic exam normal       Recent  labs reviewed at bedside with pt, repeat LFT in 1 month with lipid panel, denies ETOH or heavy tylenol ose    Cervical cancer screening       PAP Smear done   Relevant Orders   Pap IG and HPV (high risk) DNA detection   Screen for STD (sexually transmitted disease)       Relevant Orders   WET PREP FOR [place], YEAST, CLUE (Completed)   C. trachomatis/N. gonorrhoeae RNA      Note: This dictation was prepared with Dragon dictation along with smaller Company secretary. Any transcriptional errors that result from this process are unintentional.

## 2020-07-11 NOTE — Patient Instructions (Addendum)
F/U 4 weeks for lab visit only  Schedule your mammogram

## 2020-07-12 LAB — C. TRACHOMATIS/N. GONORRHOEAE RNA
C. trachomatis RNA, TMA: NOT DETECTED
N. gonorrhoeae RNA, TMA: NOT DETECTED

## 2020-07-12 NOTE — Telephone Encounter (Signed)
Call placed to patient. LMTRC.  

## 2020-07-13 ENCOUNTER — Other Ambulatory Visit: Payer: Self-pay

## 2020-07-13 DIAGNOSIS — L709 Acne, unspecified: Secondary | ICD-10-CM

## 2020-07-13 LAB — PAP IG AND HPV HIGH-RISK: HPV DNA High Risk: NOT DETECTED

## 2020-07-13 MED ORDER — TRETINOIN 0.025 % EX CREA: TOPICAL_CREAM | Freq: Every day | CUTANEOUS | 5 refills | Status: DC

## 2020-07-13 NOTE — Telephone Encounter (Signed)
Spoke with pt, understands that she is awaiting appointment to have a colonoscopy procedure due to + fecal occult

## 2020-07-13 NOTE — Progress Notes (Signed)
Spoke with pt, she is aware she is to come in fasting for rp lab of cmet and lipid. Appt has been made for 08/15/20. Pt also requested refill of the retina-a cream for adult acne, sent to confirmed pharmacy.

## 2020-08-07 DIAGNOSIS — F4310 Post-traumatic stress disorder, unspecified: Secondary | ICD-10-CM | POA: Diagnosis not present

## 2020-08-07 DIAGNOSIS — F5101 Primary insomnia: Secondary | ICD-10-CM | POA: Diagnosis not present

## 2020-08-07 DIAGNOSIS — F3162 Bipolar disorder, current episode mixed, moderate: Secondary | ICD-10-CM | POA: Diagnosis not present

## 2020-08-15 ENCOUNTER — Other Ambulatory Visit: Payer: Medicaid Other

## 2020-08-28 ENCOUNTER — Other Ambulatory Visit: Payer: Self-pay

## 2020-08-28 ENCOUNTER — Other Ambulatory Visit: Payer: Medicaid Other

## 2020-08-28 DIAGNOSIS — R7989 Other specified abnormal findings of blood chemistry: Secondary | ICD-10-CM

## 2020-08-28 DIAGNOSIS — E785 Hyperlipidemia, unspecified: Secondary | ICD-10-CM | POA: Diagnosis not present

## 2020-08-29 LAB — COMPREHENSIVE METABOLIC PANEL
AG Ratio: 2.2 (calc) (ref 1.0–2.5)
ALT: 65 U/L — ABNORMAL HIGH (ref 6–29)
AST: 67 U/L — ABNORMAL HIGH (ref 10–35)
Albumin: 4.4 g/dL (ref 3.6–5.1)
Alkaline phosphatase (APISO): 59 U/L (ref 37–153)
BUN: 13 mg/dL (ref 7–25)
CO2: 30 mmol/L (ref 20–32)
Calcium: 9.1 mg/dL (ref 8.6–10.4)
Chloride: 104 mmol/L (ref 98–110)
Creat: 0.84 mg/dL (ref 0.50–0.99)
Globulin: 2 g/dL (calc) (ref 1.9–3.7)
Glucose, Bld: 82 mg/dL (ref 65–99)
Potassium: 4.5 mmol/L (ref 3.5–5.3)
Sodium: 141 mmol/L (ref 135–146)
Total Bilirubin: 0.3 mg/dL (ref 0.2–1.2)
Total Protein: 6.4 g/dL (ref 6.1–8.1)

## 2020-08-29 LAB — LIPID PANEL
Cholesterol: 180 mg/dL (ref <200)
HDL: 70 mg/dL (ref >=50)
LDL Cholesterol (Calc): 91 mg/dL (calc)
Non-HDL Cholesterol (Calc): 110 mg/dL (calc) (ref <130)
Total CHOL/HDL Ratio: 2.6 (calc) (ref <5.0)
Triglycerides: 94 mg/dL (ref <150)

## 2020-09-04 NOTE — Progress Notes (Signed)
RUQ ultrasound Ordered due to Elevated LFT

## 2020-09-17 ENCOUNTER — Ambulatory Visit (HOSPITAL_COMMUNITY)
Admission: RE | Admit: 2020-09-17 | Discharge: 2020-09-17 | Disposition: A | Discharge status: Home / Self Care | Payer: Medicaid Other | Source: Ambulatory Visit | Attending: Family Medicine | Admitting: Family Medicine

## 2020-09-17 ENCOUNTER — Other Ambulatory Visit: Payer: Self-pay

## 2020-09-17 DIAGNOSIS — R7989 Other specified abnormal findings of blood chemistry: Secondary | ICD-10-CM | POA: Insufficient documentation

## 2020-09-17 DIAGNOSIS — R945 Abnormal results of liver function studies: Secondary | ICD-10-CM | POA: Diagnosis not present

## 2020-09-17 DIAGNOSIS — E785 Hyperlipidemia, unspecified: Secondary | ICD-10-CM | POA: Insufficient documentation

## 2020-10-31 ENCOUNTER — Other Ambulatory Visit: Payer: Self-pay | Admitting: Family Medicine

## 2020-10-31 DIAGNOSIS — E7800 Pure hypercholesterolemia, unspecified: Secondary | ICD-10-CM

## 2020-11-05 ENCOUNTER — Telehealth: Payer: Self-pay | Admitting: Family Medicine

## 2020-11-05 DIAGNOSIS — F3162 Bipolar disorder, current episode mixed, moderate: Secondary | ICD-10-CM | POA: Diagnosis not present

## 2020-11-05 DIAGNOSIS — F5101 Primary insomnia: Secondary | ICD-10-CM | POA: Diagnosis not present

## 2020-11-05 NOTE — Telephone Encounter (Signed)
Lab results faxed to [phone] attn Gerrie Nordmann

## 2020-11-09 ENCOUNTER — Encounter: Payer: Self-pay | Admitting: Family Medicine

## 2020-11-09 ENCOUNTER — Ambulatory Visit: Payer: Medicaid Other | Admitting: Family Medicine

## 2020-11-09 ENCOUNTER — Other Ambulatory Visit: Payer: Self-pay

## 2020-11-09 VITALS — BP 112/64 | HR 50 | Temp 98.1°F | Resp 14 | Ht 62.0 in | Wt 128.0 lb

## 2020-11-09 DIAGNOSIS — R591 Generalized enlarged lymph nodes: Secondary | ICD-10-CM | POA: Diagnosis not present

## 2020-11-09 DIAGNOSIS — Z1211 Encounter for screening for malignant neoplasm of colon: Secondary | ICD-10-CM | POA: Diagnosis not present

## 2020-11-09 MED ORDER — TRIAMCINOLONE ACETONIDE 0.1 % EX CREA: TOPICAL_CREAM | CUTANEOUS | 0 refills | Status: DC

## 2020-11-09 NOTE — Progress Notes (Signed)
Subjective:    Patient ID: Natalie [name], female    DOB: July 03, 1961, 59 y.o.   MRN: [id number]  HPI   Patient presents with a 1 month history of swollen glands in her neck.  On the left sternocleidomastoid muscle, there is an enlarged tender lymph node.  Is roughly 2 cm x 1.5 cm.  It is deep and poorly circumscribed.  However it is tender to touch.  She states that is coming and going.  She also complains of lymph nodes that come and go in her axilla that are swollen and tender at times and at other times she has a difficult time feeling them.  She also reports night sweats, fatigue, chills, and generally feeling tired.  This is all been going on over the last 6 weeks or so. Past Medical History:  Diagnosis Date  . Allergy    seasonal  . Anxiety    bipolar  . Bipolar 1 disorder ([place])   . Depression   . Glaucoma    stage 2  . Hyperlipidemia   . Smoker    Past Surgical History:  Procedure Laterality Date  . [place]   face after accident  . TUBAL LIGATION  1993   Current Outpatient Medications on File Prior to Visit  Medication Sig Dispense Refill  . Erenumab-aooe (AIMOVIG) 70 MG/ML SOAJ INJECT 70 MG INTO THE SKIN EVERY 30 (THIRTY) DAYS. 1 mL 3  . hydrOXYzine (ATARAX/VISTARIL) 25 MG tablet Take 1 tablet (25 mg total) by mouth 3 (three) times daily as needed. 30 tablet 0  . lamoTRIgine (LAMICTAL) 100 MG tablet Take 1 tablet (100 mg total) by mouth every morning. 30 tablet 0  . PARoxetine (PAXIL) 20 MG tablet Take 1 tablet (20 mg total) by mouth daily.    . pravastatin (PRAVACHOL) 40 MG tablet TAKE 1 TABLET BY MOUTH EVERY DAY IN THE EVENING 30 tablet 11  . QUEtiapine (SEROQUEL) 100 MG tablet Take 1 tablet (100 mg total) by mouth at bedtime.    . tretinoin (RETIN-A) 0.025 % cream Apply topically at bedtime. 45 g 5  . triamcinolone cream (KENALOG) 0.1 % APPLY 1 APPLICATION TOPICALLY 2 TIMES DAILY FOR 7 DAYS (Patient not taking: Reported on 11/09/2020) 45 g 0   No current  facility-administered medications on file prior to visit.   Allergies  Allergen Reactions  . Sulfa Antibiotics Anaphylaxis and Swelling     flu like sx  . Aspirin Other (See Comments)    Has ulcers  . Codeine Nausea And Vomiting and Other (See Comments)    Stomach pain  . Hydrocodone Nausea And Vomiting  . Mobic [Meloxicam] Nausea And Vomiting  . Topamax [Topiramate] Other (See Comments)    Dizziness, Weird taste in mouth,sick feeling  . Tylenol [Acetaminophen] Other (See Comments)    Has ulcers   Social History   Socioeconomic History  . Marital status: Married    Spouse name: Not on file  . Number of children: Not on file  . Years of education: Not on file  . Highest education level: Not on file  Occupational History  . Not on file  Tobacco Use  . Smoking status: Current Every Day Smoker    Packs/day: 0.25    Years: 15.00    Pack years: 3.75    Types: Cigarettes  . Smokeless tobacco: Never Used  . Tobacco comment: quit 03/2014  Substance and Sexual Activity  . Alcohol use: No    Alcohol/week: 0.0 standard  drinks  . Drug use: No  . Sexual activity: Not Currently  Other Topics Concern  . Not on file  Social History Narrative  . Not on file   Social Determinants of Health   Financial Resource Strain: Not on file  Food Insecurity: Not on file  Transportation Needs: Not on file  Physical Activity: Not on file  Stress: Not on file  Social Connections: Not on file  Intimate Partner Violence: Not on file      Review of Systems  Skin: Positive for rash.  All other systems reviewed and are negative.      Objective:   Physical Exam Vitals reviewed.  Constitutional:      General: She is not in acute distress.    Appearance: She is well-developed and normal weight. She is not ill-appearing, toxic-appearing or diaphoretic.  HENT:     Head: Normocephalic and atraumatic.  Eyes:     General: No visual field deficit or scleral icterus.    Extraocular  Movements: Extraocular movements intact.     Right eye: Normal extraocular motion and no nystagmus.     Left eye: Normal extraocular motion.     Pupils: Pupils are equal, round, and reactive to light. Pupils are equal.     Right eye: Pupil is round and reactive.     Left eye: Pupil is round and reactive.  Neck:   Cardiovascular:     Rate and Rhythm: Normal rate and regular rhythm.     Heart sounds: Normal heart sounds. No murmur heard. No friction rub. No gallop.   Pulmonary:     Effort: Pulmonary effort is normal. No respiratory distress.     Breath sounds: Normal breath sounds. No stridor. No wheezing or rales.  Chest:  Breasts:     Right: No axillary adenopathy or supraclavicular adenopathy.     Left: No axillary adenopathy or supraclavicular adenopathy.    Abdominal:     General: Bowel sounds are normal.     Palpations: Abdomen is soft.  Musculoskeletal:     Cervical back: Normal range of motion and neck supple.  Lymphadenopathy:     Cervical: Cervical adenopathy present.     Upper Body:     Right upper body: No supraclavicular or axillary adenopathy.     Left upper body: No supraclavicular or axillary adenopathy.  Neurological:     Mental Status: She is alert.     Cranial Nerves: No cranial nerve deficit, dysarthria or facial asymmetry.     Sensory: No sensory deficit.     Motor: No weakness.     Coordination: Romberg sign negative. Coordination normal.     Gait: Gait normal.  Psychiatric:        Mood and Affect: Mood normal.        Speech: Speech normal.        Behavior: Behavior normal.           Assessment & Plan:  Lymphadenopathy of head and neck Patient certainly has concerning systemic symptoms including fever chills and night sweats.  She also appears to have lost weight since I last saw her and she feels tired.  She also has a tender swollen lymph node in the left side of her neck.  Therefore I will proceed with a CT scan of the neck to evaluate further  and also check a CBC, CMP, and a sed rate.  Await the results of the above-mentioned studies.

## 2020-11-10 LAB — COMPLETE METABOLIC PANEL WITH GFR
AG Ratio: 1.9 (calc) (ref 1.0–2.5)
ALT: 30 U/L — ABNORMAL HIGH (ref 6–29)
AST: 31 U/L (ref 10–35)
Albumin: 4.8 g/dL (ref 3.6–5.1)
Alkaline phosphatase (APISO): 70 U/L (ref 37–153)
BUN: 9 mg/dL (ref 7–25)
CO2: 29 mmol/L (ref 20–32)
Calcium: 9.8 mg/dL (ref 8.6–10.4)
Chloride: 101 mmol/L (ref 98–110)
Creat: 0.86 mg/dL (ref 0.50–0.99)
GFR, Est African American: 84 mL/min/{1.73_m2} (ref >=60)
GFR, Est Non African American: 72 mL/min/{1.73_m2} (ref >=60)
Globulin: 2.5 g/dL (calc) (ref 1.9–3.7)
Glucose, Bld: 87 mg/dL (ref 65–99)
Potassium: 4.4 mmol/L (ref 3.5–5.3)
Sodium: 138 mmol/L (ref 135–146)
Total Bilirubin: 0.5 mg/dL (ref 0.2–1.2)
Total Protein: 7.3 g/dL (ref 6.1–8.1)

## 2020-11-10 LAB — CBC WITH DIFFERENTIAL/PLATELET
Absolute Monocytes: 489 cells/uL (ref 200–950)
Basophils Absolute: 37 cells/uL (ref 0–200)
Basophils Relative: 0.5 %
Eosinophils Absolute: 256 cells/uL (ref 15–500)
Eosinophils Relative: 3.5 %
HCT: 44 % (ref 35.0–45.0)
Hemoglobin: 14.7 g/dL (ref 11.7–15.5)
Lymphs Abs: 2730 cells/uL (ref 850–3900)
MCH: 32.7 pg (ref 27.0–33.0)
MCHC: 33.4 g/dL (ref 32.0–36.0)
MCV: 98 fL (ref 80.0–100.0)
MPV: 9.7 fL (ref 7.5–12.5)
Monocytes Relative: 6.7 %
Neutro Abs: 3789 cells/uL (ref 1500–7800)
Neutrophils Relative %: 51.9 %
Platelets: 215 10*3/uL (ref 140–400)
RBC: 4.49 10*6/uL (ref 3.80–5.10)
RDW: 13.1 % (ref 11.0–15.0)
Total Lymphocyte: 37.4 %
WBC: 7.3 10*3/uL (ref 3.8–10.8)

## 2020-11-10 LAB — SEDIMENTATION RATE: Sed Rate: 2 mm/h (ref 0–30)

## 2020-11-13 ENCOUNTER — Encounter: Payer: Self-pay | Admitting: Internal Medicine

## 2020-11-15 ENCOUNTER — Telehealth: Payer: Self-pay | Admitting: *Deleted

## 2020-11-15 NOTE — Telephone Encounter (Signed)
Received VM from patient.   Requested ABTx for sinus issues.   Call placed to patient to inquire.   Line noted busy.

## 2020-11-19 ENCOUNTER — Encounter: Payer: Self-pay | Admitting: *Deleted

## 2020-11-19 NOTE — Telephone Encounter (Signed)
Multiple calls placed to patient with no answer and no return call.   Message to be closed.  

## 2020-11-30 ENCOUNTER — Other Ambulatory Visit (HOSPITAL_COMMUNITY): Payer: Self-pay | Admitting: Family Medicine

## 2020-11-30 DIAGNOSIS — Z1231 Encounter for screening mammogram for malignant neoplasm of breast: Secondary | ICD-10-CM

## 2020-12-03 ENCOUNTER — Ambulatory Visit: Payer: Medicaid Other

## 2020-12-03 ENCOUNTER — Ambulatory Visit (HOSPITAL_COMMUNITY)
Admission: RE | Admit: 2020-12-03 | Discharge: 2020-12-03 | Disposition: A | Discharge status: Home / Self Care | Payer: Medicaid Other | Source: Ambulatory Visit | Attending: Family Medicine | Admitting: Family Medicine

## 2020-12-03 DIAGNOSIS — Z1231 Encounter for screening mammogram for malignant neoplasm of breast: Secondary | ICD-10-CM | POA: Insufficient documentation

## 2020-12-24 ENCOUNTER — Ambulatory Visit (HOSPITAL_COMMUNITY): Payer: Medicaid Other

## 2021-01-13 NOTE — Progress Notes (Signed)
Referring Provider:Pickard, Cammie Mcgee, MD Primary Care Physician:  Susy Frizzle, MD Primary Gastroenterologist:  Dr. Abbey Chatters  Chief Complaint  Patient presents with   Colonoscopy    Last tcs 2012-polyps    HPI:   Natalie [name] is a 60 y.o. female presenting today at the request of Susy Frizzle, MD for colon cancer screening. OV due meds. Last colonoscopy in 2012 with Dr. Deatra Ina with 2 small polyps- tubular adenoma x1 and benign polypoid colonic mucosa x 1.   Also with mildly elevated LFTs in December 2021. Hep C Ab negative. RUQ [place] in February 2022 with normal appearing liver and gallbladder. Most recent LFTs improving with AST 31 (normal), ALT 30 (ULN 29).   Today:  Over the last 2 weeks, she has had postprandial diarrhea with associated abdominal pain.  Not sure if she had a "stomach bug".  States she is having several watery bowel movements per day along with nocturnal bowel movements.  Symptoms have started to improve.  Continues to have mild lower abdominal pain prior to a bowel movement that improves thereafter. She had 2 bowel movements yesterday that were watery.  No nocturnal bowel movements.  No bowel movement today.  She had toast for breakfast.  Drinks well water. No antibiotics. No sick contacts.   No fever, chills, nausea, or vomiting. Took pepto bismol and this caused dark stools.  No bright red blood per rectum. No GERD symptoms or dysphagia.   No longer taking pravastatin.  This was stopped due to elevated LFTs. Had been on pravastatin for 4-5 years.  No alcohol use or history of drug use.  No new medications.  No OTC supplements or herbal teas.  No regular Tylenol use.    No unintentional weight loss.   Past Medical History:  Diagnosis Date   Allergy    seasonal   Anxiety    bipolar   Bipolar 1 disorder ([place])    Depression    Glaucoma    stage 2   Hyperlipidemia    Smoker     Past Surgical History:  Procedure Laterality Date   COLONOSCOPY   2012   Dr. Deatra Ina; 1 tubular adenoma.   COSMETIC SURGERY  08/04/1965   face after accident   TUBAL LIGATION  08/05/1991    Current Outpatient Medications  Medication Sig Dispense Refill   diphenhydrAMINE (BENADRYL) 25 MG tablet Take 25 mg by mouth every 6 (six) hours as needed.     Erenumab-aooe (AIMOVIG) 70 MG/ML SOAJ INJECT 70 MG INTO THE SKIN EVERY 30 (THIRTY) DAYS. 1 mL 3   hydrOXYzine (ATARAX/VISTARIL) 25 MG tablet Take 1 tablet (25 mg total) by mouth 3 (three) times daily as needed. 30 tablet 0   lamoTRIgine (LAMICTAL) 100 MG tablet Take 1 tablet (100 mg total) by mouth every morning. 30 tablet 0   Multiple Vitamins-Minerals (CENTRUM SILVER 50+WOMEN PO) Take 1 tablet by mouth daily.     PARoxetine (PAXIL) 20 MG tablet Take 1 tablet (20 mg total) by mouth daily.     QUEtiapine (SEROQUEL XR) 50 MG TB24 24 hr tablet Take 25 mg by mouth as needed.     QUEtiapine (SEROQUEL) 100 MG tablet Take 1 tablet (100 mg total) by mouth at bedtime.     tretinoin (RETIN-A) 0.025 % cream Apply topically at bedtime. 45 g 5   triamcinolone cream (KENALOG) 0.1 % APPLY 1 APPLICATION TOPICALLY 2 TIMES DAILY FOR 7 DAYS 45 g 0   No current facility-administered medications  for this visit.    Allergies as of 01/14/2021 - Review Complete 01/14/2021  Allergen Reaction Noted   Sulfa antibiotics Anaphylaxis and Swelling 02/28/2011   Aspirin Other (See Comments) 04/03/2012   Codeine Nausea And Vomiting and Other (See Comments) 04/10/2011   Hydrocodone Nausea And Vomiting 02/28/2011   Mobic [meloxicam] Nausea And Vomiting 11/09/2013   Topamax [topiramate] Other (See Comments) 09/03/2018   Tylenol [acetaminophen] Other (See Comments) 04/03/2012    Family History  Problem Relation Age of Onset   Hypertension Father    Hyperlipidemia Father    Colon cancer Maternal Grandfather    Cancer Neg Hx    Heart disease Neg Hx     Social History   Socioeconomic History   Marital status: Married    Spouse  name: Not on file   Number of children: Not on file   Years of education: Not on file   Highest education level: Not on file  Occupational History   Not on file  Tobacco Use   Smoking status: Every Day    Packs/day: 0.25    Years: 15.00    Pack years: 3.75    Types: Cigarettes   Smokeless tobacco: Never   Tobacco comments:    quit 03/2014  Substance and Sexual Activity   Alcohol use: No    Alcohol/week: 0.0 standard drinks   Drug use: No   Sexual activity: Not Currently  Other Topics Concern   Not on file  Social History Narrative   Not on file   Social Determinants of Health   Financial Resource Strain: Not on file  Food Insecurity: Not on file  Transportation Needs: Not on file  Physical Activity: Not on file  Stress: Not on file  Social Connections: Not on file  Intimate Partner Violence: Not on file    Review of Systems: Gen: See HPI CV: Denies chest pain or palpitations. Resp: Denies shortness of breath or cough. GI: See HPI GU : Denies urinary burning, urinary frequency, urinary hesitancy Derm: Denies rash Psych: Admits to anxiety.  Heme: See HPI  Physical Exam: BP (!) 103/58   Pulse 65   Temp (!) 96.2 F (35.7 C) (Temporal)   Ht '5\' 2"'  (1.575 m)   Wt 132 lb 9.6 oz (60.1 kg)   LMP 11/17/2010   BMI 24.25 kg/m  General:   Alert and oriented. Pleasant and cooperative. Well-nourished and well-developed.  Head:  Normocephalic and atraumatic. Eyes:  Without icterus, sclera clear and conjunctiva pink.  Lungs:  Clear to auscultation bilaterally. No wheezes, rales, or rhonchi. No distress.  Heart:  S1, S2 present without murmurs appreciated.  Abdomen:  +BS, soft, non-tender and non-distended. No HSM noted. No guarding or rebound. No masses appreciated.  Rectal:  Deferred  Msk:  Symmetrical without gross deformities. Normal posture. Extremities:  Without edema. Neurologic:  Alert and  oriented x4;  grossly normal neurologically. Skin:  Intact without  significant lesions or rashes. Psych:  Normal mood and affect.    Assessment:  60 year old female with history of bipolar disorder, depression, anxiety, HLD, glaucoma, adenomatous colon polyps presenting today to schedule surveillance colonoscopy.  Also discussed elevated LFTs and diarrhea.  History of adenomatous colon polyps: Also with family history significant for maternal grandfather with colon cancer.  Last colonoscopy in 2012 with Dr. Deatra Ina with 1 tubular adenoma and benign polypoid colonic mucosa x1.  Lower GI symptoms include acute onset diarrhea 2 weeks ago with associated abdominal pain that is slowly improving.  No  alarm symptoms.  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  As symptoms are improving, we will hold off on stool studies.  Advised to monitor for return of significant diarrhea and let me know if this occurs.  Recommended starting daily probiotic, continue bland diet, and advance as tolerated.  Elevated LFTs: Mildly elevated LFTs in December 2021 with AST 40, ALT 35, alk phos and total bilirubin within normal limits.  Most recent labs April 2022 with LFTs improved, only with mildly elevated ALT at 30.  She had been on pravastatin for 4 to 5 years, and this was discontinued.  Denies any other medication changes, new medications, OTC supplements, Tylenol, alcohol, IV or intranasal drug use.  Hep C antibody negative.  RUQ ultrasound on file with normal-appearing liver and gallbladder.   Mild elevation may have been secondary to pravastatin.  We will recheck LFTs at this time.  Further recommendations to follow.  Plan: 1.  Proceed with colonoscopy with propofol with Dr. Abbey Chatters in the near future.  The risks, benefits, and alternatives have been discussed with the patient in detail. The patient states understanding and desires to proceed.  ASA II  2.  Start probiotic daily.  3.  Monitor for return of significant diarrhea and let  me know if this occurs.  Would obtain stool studies.  4.  Repeat HFP.  5.  Follow-up after colonoscopy.    Aliene Altes, PA-C [facility] 01/14/2021

## 2021-01-14 ENCOUNTER — Encounter: Payer: Self-pay | Admitting: Gastroenterology

## 2021-01-14 ENCOUNTER — Other Ambulatory Visit: Payer: Self-pay

## 2021-01-14 ENCOUNTER — Ambulatory Visit (INDEPENDENT_AMBULATORY_CARE_PROVIDER_SITE_OTHER): Payer: Medicaid Other | Admitting: Gastroenterology

## 2021-01-14 VITALS — BP 103/58 | HR 65 | Temp 96.2°F | Ht 62.0 in | Wt 132.6 lb

## 2021-01-14 DIAGNOSIS — R7989 Other specified abnormal findings of blood chemistry: Secondary | ICD-10-CM | POA: Diagnosis not present

## 2021-01-14 DIAGNOSIS — Z86010 Personal history of colonic polyps: Secondary | ICD-10-CM | POA: Diagnosis not present

## 2021-01-14 DIAGNOSIS — Z860101 Personal history of adenomatous and serrated colon polyps: Secondary | ICD-10-CM | POA: Insufficient documentation

## 2021-01-14 DIAGNOSIS — R197 Diarrhea, unspecified: Secondary | ICD-10-CM | POA: Diagnosis not present

## 2021-01-14 NOTE — Patient Instructions (Signed)
I suspect he may have had an acute gastroenteritis as the cause of your diarrhea couple of weeks ago.  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  This should continue to resolve with time.  I recommend you start a daily probiotic.  If you good options include align, digestive advantage, and Hardin Negus' colon health.  Continue to follow a bland diet for the next few days and advance as tolerated.   We will arrange for you to have a colonoscopy in the near future with Dr. Abbey Chatters at [facility].  Please have blood work completed at your primary care provider's office to follow-up on elevated LFTs.

## 2021-01-15 ENCOUNTER — Encounter: Payer: Self-pay | Admitting: Gastroenterology

## 2021-01-15 ENCOUNTER — Telehealth: Payer: Self-pay | Admitting: *Deleted

## 2021-01-15 NOTE — Telephone Encounter (Signed)
LMOVM to call back to schedule TCS with propofol, Dr. Abbey Chatters, ASA 2

## 2021-01-16 NOTE — Telephone Encounter (Signed)
Letter mailed

## 2021-02-08 DIAGNOSIS — F5101 Primary insomnia: Secondary | ICD-10-CM | POA: Diagnosis not present

## 2021-02-08 DIAGNOSIS — F3162 Bipolar disorder, current episode mixed, moderate: Secondary | ICD-10-CM | POA: Diagnosis not present

## 2021-04-02 ENCOUNTER — Other Ambulatory Visit: Payer: Self-pay | Admitting: Family Medicine

## 2021-04-04 ENCOUNTER — Telehealth: Payer: Self-pay | Admitting: *Deleted

## 2021-04-04 DIAGNOSIS — Z8669 Personal history of other diseases of the nervous system and sense organs: Secondary | ICD-10-CM

## 2021-04-04 NOTE — Telephone Encounter (Signed)
Received request from pharmacy for [place] on [address].   PA submitted.   Dx: G43.909- [phone]  Received the following message from insurance.   The member recently filled this medication and will be able to return for their next refill according to their plan limits.

## 2021-05-21 DIAGNOSIS — F5101 Primary insomnia: Secondary | ICD-10-CM | POA: Diagnosis not present

## 2021-05-21 DIAGNOSIS — F3162 Bipolar disorder, current episode mixed, moderate: Secondary | ICD-10-CM | POA: Diagnosis not present

## 2021-05-21 DIAGNOSIS — F4310 Post-traumatic stress disorder, unspecified: Secondary | ICD-10-CM | POA: Diagnosis not present

## 2021-07-05 ENCOUNTER — Other Ambulatory Visit: Payer: Self-pay

## 2021-07-05 MED ORDER — AIMOVIG 70 MG/ML ~~LOC~~ SOAJ
SUBCUTANEOUS | 3 refills | Status: DC
Start: 2021-07-05 — End: 2021-07-09

## 2021-07-08 ENCOUNTER — Other Ambulatory Visit: Payer: Self-pay

## 2021-07-09 MED ORDER — EMGALITY 120 MG/ML ~~LOC~~ SOAJ: 120.0000 mg | SUBCUTANEOUS | 5 refills | Status: DC

## 2021-07-09 NOTE — Addendum Note (Signed)
Addended by: Wadie Lessen on: 07/09/2021 04:40 PM   Modules accepted: Orders

## 2021-07-09 NOTE — Telephone Encounter (Signed)
Spoke with pt and advised of PA denial. She is willing to try Emgality. Rx sent to pharmacy, although unsure if this will be covered. Pt aware.

## 2021-07-09 NOTE — Telephone Encounter (Signed)
PA for Aimovig was denied. Please advise on alternative options. Thanks!

## 2021-07-16 ENCOUNTER — Encounter: Payer: Self-pay | Admitting: Family Medicine

## 2021-07-16 NOTE — Progress Notes (Signed)
Emgality prior auth  (Key: [id number])  This request has received a Favorable outcome.  Please note any additional information provided by [facility] at the bottom of this request

## 2021-08-28 DIAGNOSIS — F3162 Bipolar disorder, current episode mixed, moderate: Secondary | ICD-10-CM | POA: Diagnosis not present

## 2021-08-28 DIAGNOSIS — F5101 Primary insomnia: Secondary | ICD-10-CM | POA: Diagnosis not present

## 2021-08-28 DIAGNOSIS — F4310 Post-traumatic stress disorder, unspecified: Secondary | ICD-10-CM | POA: Diagnosis not present

## 2021-10-02 ENCOUNTER — Telehealth: Payer: Self-pay

## 2021-10-02 ENCOUNTER — Other Ambulatory Visit: Payer: Self-pay

## 2021-10-02 ENCOUNTER — Encounter: Payer: Self-pay | Admitting: Family Medicine

## 2021-10-02 ENCOUNTER — Ambulatory Visit (INDEPENDENT_AMBULATORY_CARE_PROVIDER_SITE_OTHER): Payer: Medicaid Other | Admitting: Family Medicine

## 2021-10-02 VITALS — BP 112/78 | HR 66 | Temp 97.5°F | Resp 18 | Ht 62.0 in | Wt 159.0 lb

## 2021-10-02 DIAGNOSIS — Z23 Encounter for immunization: Secondary | ICD-10-CM | POA: Diagnosis not present

## 2021-10-02 DIAGNOSIS — Z122 Encounter for screening for malignant neoplasm of respiratory organs: Secondary | ICD-10-CM

## 2021-10-02 DIAGNOSIS — Z8669 Personal history of other diseases of the nervous system and sense organs: Secondary | ICD-10-CM

## 2021-10-02 DIAGNOSIS — Z86010 Personal history of colonic polyps: Secondary | ICD-10-CM

## 2021-10-02 DIAGNOSIS — Z8659 Personal history of other mental and behavioral disorders: Secondary | ICD-10-CM

## 2021-10-02 DIAGNOSIS — F17200 Nicotine dependence, unspecified, uncomplicated: Secondary | ICD-10-CM | POA: Diagnosis not present

## 2021-10-02 DIAGNOSIS — E785 Hyperlipidemia, unspecified: Secondary | ICD-10-CM

## 2021-10-02 DIAGNOSIS — Z0000 Encounter for general adult medical examination without abnormal findings: Secondary | ICD-10-CM

## 2021-10-02 LAB — COMPLETE METABOLIC PANEL WITH GFR
AG Ratio: 1.8 (calc) (ref 1.0–2.5)
ALT: 10 U/L (ref 6–29)
AST: 15 U/L (ref 10–35)
Albumin: 4.4 g/dL (ref 3.6–5.1)
Alkaline phosphatase (APISO): 80 U/L (ref 37–153)
BUN: 13 mg/dL (ref 7–25)
CO2: 31 mmol/L (ref 20–32)
Calcium: 9.4 mg/dL (ref 8.6–10.4)
Chloride: 102 mmol/L (ref 98–110)
Creat: 0.98 mg/dL (ref 0.50–1.05)
Globulin: 2.4 g/dL (calc) (ref 1.9–3.7)
Glucose, Bld: 91 mg/dL (ref 65–99)
Potassium: 4.4 mmol/L (ref 3.5–5.3)
Sodium: 138 mmol/L (ref 135–146)
Total Bilirubin: 0.3 mg/dL (ref 0.2–1.2)
Total Protein: 6.8 g/dL (ref 6.1–8.1)
eGFR: 65 mL/min/{1.73_m2} (ref >=60)

## 2021-10-02 LAB — CBC WITH DIFFERENTIAL/PLATELET
Absolute Monocytes: 376 cells/uL (ref 200–950)
Basophils Absolute: 28 cells/uL (ref 0–200)
Basophils Relative: 0.4 %
Eosinophils Absolute: 362 cells/uL (ref 15–500)
Eosinophils Relative: 5.1 %
HCT: 39.2 % (ref 35.0–45.0)
Hemoglobin: 13.2 g/dL (ref 11.7–15.5)
Lymphs Abs: 2918 cells/uL (ref 850–3900)
MCH: 32.2 pg (ref 27.0–33.0)
MCHC: 33.7 g/dL (ref 32.0–36.0)
MCV: 95.6 fL (ref 80.0–100.0)
MPV: 9.9 fL (ref 7.5–12.5)
Monocytes Relative: 5.3 %
Neutro Abs: 3415 cells/uL (ref 1500–7800)
Neutrophils Relative %: 48.1 %
Platelets: 216 10*3/uL (ref 140–400)
RBC: 4.1 10*6/uL (ref 3.80–5.10)
RDW: 13.5 % (ref 11.0–15.0)
Total Lymphocyte: 41.1 %
WBC: 7.1 10*3/uL (ref 3.8–10.8)

## 2021-10-02 LAB — LIPID PANEL
Cholesterol: 290 mg/dL — ABNORMAL HIGH (ref <200)
HDL: 56 mg/dL (ref >=50)
LDL Cholesterol (Calc): 193 mg/dL (calc) — ABNORMAL HIGH
Non-HDL Cholesterol (Calc): 234 mg/dL (calc) — ABNORMAL HIGH (ref <130)
Total CHOL/HDL Ratio: 5.2 (calc) — ABNORMAL HIGH (ref <5.0)
Triglycerides: 218 mg/dL — ABNORMAL HIGH (ref <150)

## 2021-10-02 MED ORDER — ALPRAZOLAM 0.5 MG PO TABS: 0.5000 mg | ORAL_TABLET | Freq: Three times a day (TID) | ORAL | 0 refills | Status: DC | PRN

## 2021-10-02 MED ORDER — EMGALITY 120 MG/ML ~~LOC~~ SOAJ: 120.0000 mg | SUBCUTANEOUS | 5 refills | Status: DC

## 2021-10-02 NOTE — Telephone Encounter (Signed)
Pharmacy faxed refill request for  ? ?Galcanezumab-gnlm [facility]) 120 MG/ML SOAJ [[id number]]  ?  Order Details ?Dose: 120 mg Route: Subcutaneous Frequency: Every 30 days  ?Dispense Quantity: 1.12 mL Refills: 5   ?     ?Sig: Inject 120 mg into the skin every 30 (thirty) days  ? ?

## 2021-10-02 NOTE — Telephone Encounter (Signed)
Refill sent to pharmacy.   

## 2021-10-02 NOTE — Progress Notes (Signed)
? ?Subjective:  ? ? Patient ID: Natalie [name], female    DOB: 18-Sep-1960, 61 y.o.   MRN: [id number] ? ?She is a very pleasant 61 year old white female here today for complete physical exam.  Past medical history is significant for hyperlipidemia, tobacco abuse, bipolar disorder, and glaucoma.  Patient has a history of colon polyps.  She is overdue for colonoscopy.  We performed a Cologuard test in 2021 that was negative.  However now the patient would like to proceed with a colonoscopy so I will schedule her for this.  Unfortunately she continues to smoke.  She is overdue for lung cancer screening.  She states that the reason she cannot quit smoking is going to stop smoking she becomes extremely anxious and agitated.  She is requesting a one-time prescription for Xanax that she may take to help her try to quit smoking.  I will be willing to do this to help facilitate smoking cessation.  She is not due for a bone density test until she is 31 however she is not taking calcium and vitamin D regularly.  She is due for a flu shot today as well as a tetanus shot.  She declines the flu shot due to the fact flu season is essentially past.  However she would like a tetanus shot. ?Past Medical History:  ?Diagnosis Date  ? Allergy   ? seasonal  ? Anxiety   ? bipolar  ? Bipolar 1 disorder ([place])   ? Depression   ? Glaucoma   ? stage 2  ? Hyperlipidemia   ? Smoker   ? ?Past Surgical History:  ?Procedure Laterality Date  ? COLONOSCOPY  2012  ? Dr. Deatra Ina; 1 tubular adenoma.  ? COSMETIC SURGERY  08/04/1965  ? face after accident  ? TUBAL LIGATION  08/05/1991  ? ?Current Outpatient Medications on File Prior to Visit  ?Medication Sig Dispense Refill  ? diphenhydrAMINE (BENADRYL) 25 MG tablet Take 25 mg by mouth every 6 (six) hours as needed.    ? Galcanezumab-gnlm (EMGALITY) 120 MG/ML SOAJ Inject 120 mg into the skin every 30 (thirty) days. 1.12 mL 5  ? hydrOXYzine (ATARAX/VISTARIL) 25 MG tablet Take 1 tablet (25 mg total) by mouth 3  (three) times daily as needed. 30 tablet 0  ? lamoTRIgine (LAMICTAL) 100 MG tablet Take 1 tablet (100 mg total) by mouth every morning. 30 tablet 0  ? Multiple Vitamins-Minerals (CENTRUM SILVER 50+WOMEN PO) Take 1 tablet by mouth daily.    ? PARoxetine (PAXIL) 20 MG tablet Take 1 tablet (20 mg total) by mouth daily.    ? propranolol (INDERAL) 10 MG tablet Take 10 mg by mouth daily.    ? QUEtiapine (SEROQUEL) 25 MG tablet Take 25 mg by mouth at bedtime.    ? tretinoin (RETIN-A) 0.025 % cream Apply topically at bedtime. 45 g 5  ? triamcinolone cream (KENALOG) 0.1 % APPLY 1 APPLICATION TOPICALLY 2 TIMES DAILY FOR 7 DAYS 45 g 0  ? ?No current facility-administered medications on file prior to visit.  ? ?Allergies  ?Allergen Reactions  ? Sulfa Antibiotics Anaphylaxis and Swelling  ?   flu like sx  ? Aspirin Other (See Comments)  ?  Has ulcers  ? Codeine Nausea And Vomiting and Other (See Comments)  ?  Stomach pain  ? Hydrocodone Nausea And Vomiting  ? Mobic [Meloxicam] Nausea And Vomiting  ? Topamax [Topiramate] Other (See Comments)  ?  Dizziness, Weird taste in mouth,sick feeling  ? Tylenol [Acetaminophen] Other (See  Comments)  ?  Has ulcers  ? ?Social History  ? ?Socioeconomic History  ? Marital status: Married  ?  Spouse name: Not on file  ? Number of children: Not on file  ? Years of education: Not on file  ? Highest education level: Not on file  ?Occupational History  ? Not on file  ?Tobacco Use  ? Smoking status: Every Day  ?  Packs/day: 0.25  ?  Years: 15.00  ?  Pack years: 3.75  ?  Types: Cigarettes  ? Smokeless tobacco: Never  ? Tobacco comments:  ?  quit 03/2014  ?Substance and Sexual Activity  ? Alcohol use: No  ?  Alcohol/week: 0.0 standard drinks  ? Drug use: No  ? Sexual activity: Not Currently  ?Other Topics Concern  ? Not on file  ?Social History Narrative  ? Not on file  ? ?Social Determinants of Health  ? ?Financial Resource Strain: Not on file  ?Food Insecurity: Not on file  ?Transportation Needs: Not  on file  ?Physical Activity: Not on file  ?Stress: Not on file  ?Social Connections: Not on file  ?Intimate Partner Violence: Not on file  ? ?Family History  ?Problem Relation Age of Onset  ? Hypertension Father   ? Hyperlipidemia Father   ? Colon cancer Maternal Grandfather   ? Cancer Neg Hx   ? Heart disease Neg Hx   ? ? ? ? ?Review of Systems  ?All other systems reviewed and are negative. ? ?   ?Objective:  ? Physical Exam ?Vitals reviewed.  ?Constitutional:   ?   General: She is not in acute distress. ?   Appearance: She is well-developed. She is not diaphoretic.  ?HENT:  ?   Head: Normocephalic and atraumatic.  ?   Right Ear: External ear normal.  ?   Left Ear: External ear normal.  ?   Nose: Nose normal.  ?   Mouth/Throat:  ?   Pharynx: No oropharyngeal exudate.  ?Eyes:  ?   General: No scleral icterus.    ?   Right eye: No discharge.     ?   Left eye: No discharge.  ?   Conjunctiva/sclera: Conjunctivae normal.  ?   Pupils: Pupils are equal, round, and reactive to light.  ?Neck:  ?   Thyroid: No thyromegaly.  ?   Vascular: No JVD.  ?   Trachea: No tracheal deviation.  ?Cardiovascular:  ?   Rate and Rhythm: Normal rate and regular rhythm.  ?   Heart sounds: Normal heart sounds. No murmur heard. ?  No friction rub. No gallop.  ?Pulmonary:  ?   Effort: Pulmonary effort is normal. No respiratory distress.  ?   Breath sounds: Normal breath sounds. No wheezing or rales.  ?Chest:  ?   Chest wall: No tenderness.  ?Breasts: ?   Breasts are symmetrical.  ?   Right: No inverted nipple, mass or nipple discharge.  ?   Left: No inverted nipple, mass or nipple discharge.  ?Abdominal:  ?   General: Bowel sounds are normal. There is no distension.  ?   Palpations: Abdomen is soft. There is no mass.  ?   Tenderness: There is no abdominal tenderness. There is no guarding or rebound.  ?Genitourinary: ?   Vagina: Normal.  ?Musculoskeletal:  ?   Cervical back: Normal range of motion and neck supple.  ?Lymphadenopathy:  ?    Cervical: No cervical adenopathy.  ?Skin: ?   Coloration: Skin  is not pale.  ?   Findings: No erythema or rash.  ?Neurological:  ?   Mental Status: She is alert and oriented to person, place, and time.  ?   Cranial Nerves: No cranial nerve deficit.  ?   Motor: No abnormal muscle tone.  ?   Coordination: Coordination normal.  ?   Deep Tendon Reflexes: Reflexes are normal and symmetric. Reflexes normal.  ?Psychiatric:     ?   Behavior: Behavior normal.     ?   Thought Content: Thought content normal.     ?   Judgment: Judgment normal.  ? ? ? ? ? ?   ?Assessment & Plan:  ?Hyperlipidemia, unspecified hyperlipidemia type - Plan: CBC with Differential/Platelet, Lipid panel, COMPLETE METABOLIC PANEL WITH GFR ? ?History of colon polyps - Plan: Ambulatory referral to Gastroenterology ? ?Smoker - Plan: CBC with Differential/Platelet, Lipid panel, COMPLETE METABOLIC PANEL WITH GFR, CT CHEST LUNG CA SCREEN LOW DOSE W/O CM ? ?General medical exam - Plan: CBC with Differential/Platelet, Lipid panel, COMPLETE METABOLIC PANEL WITH GFR ? ?History of bipolar disorder ? ?Screening for lung cancer - Plan: CT CHEST LUNG CA SCREEN LOW DOSE W/O CM ?Patient received her tetanus shot today.  She is not due for a bone density test until 63.  Her next mammogram is due in May of this year and she will schedule this.  She asked me to schedule her for colonoscopy and I will contact GI.  She is also due for lung cancer screening so I will schedule a CT scan of the lungs.  Encourage smoking cessation and I will give her a one-time prescription for Xanax to try to help deal with the anxiety that she feels if she cuts back on smoking.  In the long run I feel that this would be the most beneficial thing she can do.  Check CBC CMP and fasting lipid panel.  Encouraged the patient to take 1200 mg a day of calcium and 1000 units a day of vitamin D. ?

## 2021-10-02 NOTE — Addendum Note (Signed)
Addended by: Pricilla Handler R on: 10/02/2021 10:00 AM ? ? Modules accepted: Orders ? ?

## 2021-10-09 ENCOUNTER — Telehealth: Payer: Self-pay

## 2021-10-09 NOTE — Telephone Encounter (Signed)
PA Case: [id number], Status: Approved, Coverage Starts on: 10/09/2021 12:00:00 AM, Coverage Ends on: 10/09/2022 12:00:00 AM ?

## 2021-10-09 NOTE — Telephone Encounter (Signed)
PA for Emgality started via CoverMyMeds ? ?Cecille Rubin Savage (Key: [id number]) ?

## 2021-10-30 ENCOUNTER — Other Ambulatory Visit: Payer: Medicaid Other

## 2021-12-04 DIAGNOSIS — F5101 Primary insomnia: Secondary | ICD-10-CM | POA: Diagnosis not present

## 2021-12-04 DIAGNOSIS — F4310 Post-traumatic stress disorder, unspecified: Secondary | ICD-10-CM | POA: Diagnosis not present

## 2021-12-04 DIAGNOSIS — F3162 Bipolar disorder, current episode mixed, moderate: Secondary | ICD-10-CM | POA: Diagnosis not present

## 2021-12-06 ENCOUNTER — Other Ambulatory Visit: Payer: Self-pay

## 2021-12-06 DIAGNOSIS — E7800 Pure hypercholesterolemia, unspecified: Secondary | ICD-10-CM

## 2021-12-06 MED ORDER — ROSUVASTATIN CALCIUM 20 MG PO TABS: 20.0000 mg | ORAL_TABLET | Freq: Every day | ORAL | 3 refills | Status: DC

## 2021-12-09 ENCOUNTER — Encounter: Payer: Self-pay | Admitting: Gastroenterology

## 2022-01-02 ENCOUNTER — Ambulatory Visit (AMBULATORY_SURGERY_CENTER): Payer: Self-pay | Admitting: *Deleted

## 2022-01-02 VITALS — Ht 62.0 in | Wt 142.0 lb

## 2022-01-02 DIAGNOSIS — Z86010 Personal history of colonic polyps: Secondary | ICD-10-CM

## 2022-01-02 MED ORDER — PLENVU 140 G PO SOLR: 1.0000 | ORAL | 0 refills | Status: DC

## 2022-01-02 NOTE — Progress Notes (Signed)
No egg or soy allergy known to patient  No issues known to pt with past sedation with any surgeries or procedures Patient denies ever being told they had issues or difficulty with intubation  No FH of Malignant Hyperthermia Pt is not on diet pills Pt is not on  home 02  Pt is not on blood thinners  Pt denies issues with constipation  No A fib or A flutter  NO PA's for preps discussed with pt In PV today  Discussed with pt there will be an out-of-pocket cost for prep and that varies from $0 to 70 +  dollars - pt verbalized understanding  Pt instructed to use [email or web address] or GoodRx for a price reduction on prep   Pt encouraged to call with questions or issues.  Insurance confirmed with pt at [facility] today

## 2022-01-30 ENCOUNTER — Ambulatory Visit (AMBULATORY_SURGERY_CENTER): Payer: Medicaid Other | Admitting: Gastroenterology

## 2022-01-30 ENCOUNTER — Encounter: Payer: Self-pay | Admitting: Gastroenterology

## 2022-01-30 VITALS — BP 99/60 | HR 66 | Temp 97.7°F | Resp 11 | Ht 62.0 in | Wt 142.0 lb

## 2022-01-30 DIAGNOSIS — D120 Benign neoplasm of cecum: Secondary | ICD-10-CM | POA: Diagnosis not present

## 2022-01-30 DIAGNOSIS — Z09 Encounter for follow-up examination after completed treatment for conditions other than malignant neoplasm: Secondary | ICD-10-CM | POA: Diagnosis not present

## 2022-01-30 DIAGNOSIS — Z86010 Personal history of colonic polyps: Secondary | ICD-10-CM | POA: Diagnosis not present

## 2022-01-30 DIAGNOSIS — D123 Benign neoplasm of transverse colon: Secondary | ICD-10-CM

## 2022-01-30 DIAGNOSIS — D122 Benign neoplasm of ascending colon: Secondary | ICD-10-CM

## 2022-01-30 DIAGNOSIS — Z1211 Encounter for screening for malignant neoplasm of colon: Secondary | ICD-10-CM | POA: Diagnosis not present

## 2022-01-30 MED ORDER — SODIUM CHLORIDE 0.9 % IV SOLN: 500.0000 mL | Freq: Once | INTRAVENOUS | Status: DC

## 2022-01-30 NOTE — Progress Notes (Signed)
[facility] History and Physical   Primary Care Physician:  Susy Frizzle, MD   Reason for Procedure:  History of adenomatous colon polyps  Plan:    Surveillance colonoscopy with possible interventions as needed     HPI: Natalie [name] is a very pleasant 61 y.o. female here for surveillance colonoscopy. Denies any nausea, vomiting, abdominal pain, melena or bright red blood per rectum  The risks and benefits as well as alternatives of endoscopic procedure(s) have been discussed and reviewed. All questions answered. The patient agrees to proceed.    Past Medical History:  Diagnosis Date   Allergy    seasonal   Anxiety    bipolar   Bipolar 1 disorder ([place])    Cataract    Depression    Glaucoma    stage 2   History of IBS    IBS- D   Hyperlipidemia    Smoker     Past Surgical History:  Procedure Laterality Date   COLONOSCOPY  2012   Dr. Deatra Ina; 1 tubular adenoma.   COSMETIC SURGERY  08/04/1965   face after accident   [place]  08/05/1991    Prior to Admission medications   Medication Sig Start Date End Date Taking? Authorizing Provider  lamoTRIgine (LAMICTAL) 100 MG tablet Take 1 tablet (100 mg total) by mouth every morning. 03/16/14  Yes Withrow, Elyse Jarvis, FNP  PARoxetine (PAXIL) 20 MG tablet Take 1 tablet (20 mg total) by mouth daily. 03/16/14  Yes Withrow, Elyse Jarvis, FNP  propranolol (INDERAL) 10 MG tablet Take 10 mg by mouth as needed.   Yes [provider]  QUEtiapine (SEROQUEL) 100 MG tablet Take 100 mg by mouth at bedtime. 08/28/21  Yes [provider]  QUEtiapine (SEROQUEL) 25 MG tablet Take 25 mg by mouth at bedtime. 05/21/21  Yes [provider]  rosuvastatin (CRESTOR) 20 MG tablet Take 1 tablet (20 mg total) by mouth daily. 12/06/21  Yes Susy Frizzle, MD  tretinoin (RETIN-A) 0.025 % cream Apply topically at bedtime. 07/13/20  Yes PickardCammie Mcgee, MD   Galcanezumab-gnlm (EMGALITY) 120 MG/ML SOAJ Inject 120 mg into the skin every 30 (thirty) days. Patient not taking: Reported on 01/02/2022 10/02/21   Susy Frizzle, MD  hydrOXYzine (ATARAX/VISTARIL) 25 MG tablet Take 1 tablet (25 mg total) by mouth 3 (three) times daily as needed. Patient not taking: Reported on 01/02/2022 07/04/20   Alycia Rossetti, MD  Multiple Vitamins-Minerals (CENTRUM SILVER 50+WOMEN PO) Take 1 tablet by mouth daily.    [provider]  Probiotic Product (PROBIOTIC PO) Take by mouth.    [provider]    Current Outpatient Medications  Medication Sig Dispense Refill   lamoTRIgine (LAMICTAL) 100 MG tablet Take 1 tablet (100 mg total) by mouth every morning. 30 tablet 0   PARoxetine (PAXIL) 20 MG tablet Take 1 tablet (20 mg total) by mouth daily.     propranolol (INDERAL) 10 MG tablet Take 10 mg by mouth as needed.     QUEtiapine (SEROQUEL) 100 MG tablet Take 100 mg by mouth at bedtime.     QUEtiapine (SEROQUEL) 25 MG tablet Take 25 mg by mouth at bedtime.     rosuvastatin (CRESTOR) 20 MG tablet Take 1 tablet (20 mg total) by mouth daily. 90 tablet 3   tretinoin (RETIN-A) 0.025 % cream Apply topically at bedtime. 45 g 5   Galcanezumab-gnlm (EMGALITY) 120 MG/ML  SOAJ Inject 120 mg into the skin every 30 (thirty) days. (Patient not taking: Reported on 01/02/2022) 1.12 mL 5   hydrOXYzine (ATARAX/VISTARIL) 25 MG tablet Take 1 tablet (25 mg total) by mouth 3 (three) times daily as needed. (Patient not taking: Reported on 01/02/2022) 30 tablet 0   Multiple Vitamins-Minerals (CENTRUM SILVER 50+WOMEN PO) Take 1 tablet by mouth daily.     Probiotic Product (PROBIOTIC PO) Take by mouth.     Current Facility-Administered Medications  Medication Dose Route Frequency Provider Last Rate Last Admin   0.9 %  sodium chloride infusion  500 mL Intravenous Once Mauri Pole, MD        Allergies as of 01/30/2022 - Review Complete 01/30/2022  Allergen Reaction Noted    Sulfa antibiotics Anaphylaxis and Swelling 02/28/2011   Aspirin Other (See Comments) 04/03/2012   Codeine Nausea And Vomiting and Other (See Comments) 04/10/2011   Hydrocodone Nausea And Vomiting 02/28/2011   Mobic [meloxicam] Nausea And Vomiting 11/09/2013   Topamax [topiramate] Other (See Comments) 09/03/2018   Tylenol [acetaminophen] Other (See Comments) 04/03/2012    Family History  Problem Relation Age of Onset   Colon polyps Father    Hypertension Father    Hyperlipidemia Father    Colon polyps Sister    Colon cancer Maternal Grandfather        dx'd in his 14's   Cancer Neg Hx    Heart disease Neg Hx    Stomach cancer Neg Hx    Rectal cancer Neg Hx     Social History   Socioeconomic History   Marital status: Married    Spouse name: Not on file   Number of children: Not on file   Years of education: Not on file   Highest education level: Not on file  Occupational History   Not on file  Tobacco Use   Smoking status: Former    Years: 15.00    Types: Cigarettes   Smokeless tobacco: Never   Tobacco comments:    quit 03/2014 and quit 12-02-2021  Substance and Sexual Activity   Alcohol use: No    Alcohol/week: 0.0 standard drinks of alcohol   Drug use: No   Sexual activity: Not Currently  Other Topics Concern   Not on file  Social History Narrative   Not on file   Social Determinants of Health   Financial Resource Strain: Not on file  Food Insecurity: Not on file  Transportation Needs: Not on file  Physical Activity: Not on file  Stress: Not on file  Social Connections: Not on file  Intimate Partner Violence: Not on file    Review of Systems:  All other review of systems negative except as mentioned in the HPI.  Physical Exam: Vital signs in last 24 hours: BP 115/63   Pulse (!) 57   Temp 97.7 F (36.5 C)   Resp (!) 5   Ht '5\' 2"'$  (1.575 m)   Wt 142 lb (64.4 kg)   LMP 11/16/2009   SpO2 100%   BMI 25.97 kg/m  General:   Alert, NAD Lungs:  Clear .    Heart:  Regular rate and rhythm Abdomen:  Soft, nontender and nondistended. Neuro/Psych:  Alert and cooperative. Normal mood and affect. A and O x 3  Reviewed labs, radiology imaging, old records and pertinent past GI work up  Patient is appropriate for planned procedure(s) and anesthesia in an ambulatory setting   K. Denzil Magnuson , MD [phone]

## 2022-01-30 NOTE — Progress Notes (Signed)
Called to room to assist during endoscopic procedure.  Patient ID and intended procedure confirmed with present staff. Received instructions for my participation in the procedure from the performing physician.  

## 2022-01-30 NOTE — Op Note (Signed)
[facility] Patient Name: Natalie [name] Procedure Date: 01/30/2022 7:53 AM MRN: [id number] Endoscopist: Mauri Pole , MD Age: 61 Referring MD:  Date of Birth: 30-Dec-1960 Gender: Female Account #: [id number] Procedure:                Colonoscopy Indications:              High risk colon cancer surveillance: Personal                            history of colonic polyps, High risk colon cancer                            surveillance: Personal history of adenoma less than                            10 mm in size Procedure:                Pre-Anesthesia Assessment:                           - Prior to the procedure, a History and Physical                            was performed, and patient medications and                            allergies were reviewed. The patient's tolerance of                            previous anesthesia was also reviewed. The risks                            and benefits of the procedure and the sedation                            options and risks were discussed with the patient.                            All questions were answered, and informed consent                            was obtained. Prior Anticoagulants: The patient has                            taken no previous anticoagulant or antiplatelet                            agents. ASA Grade Assessment: II - A patient with                            mild systemic disease. After reviewing the risks                            and benefits, the patient was deemed in  satisfactory condition to undergo the procedure.                           After obtaining informed consent, the colonoscope                            was passed under direct vision. Throughout the                            procedure, the patient's blood pressure, pulse, and                            oxygen saturations were monitored continuously. The                            Olympus PCF-H190DL  ([id number]) Colonoscope was                            introduced through the anus and advanced to the the                            cecum, identified by appendiceal orifice and                            ileocecal valve. The colonoscopy was performed                            without difficulty. The patient tolerated the                            procedure well. The quality of the bowel                            preparation was excellent. The ileocecal valve,                            appendiceal orifice, and rectum were photographed. Scope In: 8:12:00 AM Scope Out: 8:27:19 AM Scope Withdrawal Time: 0 hours 7 minutes 4 seconds  Total Procedure Duration: 0 hours 15 minutes 19 seconds  Findings:                 The perianal and digital rectal examinations were                            normal.                           Three sessile polyps were found in the transverse                            colon, ascending colon and cecum. The polyps were 2                            to 7 mm in size. These polyps were removed with a  cold snare. Resection and retrieval were complete.                           A few small-mouthed diverticula were found in the                            sigmoid colon.                           Non-bleeding external and internal hemorrhoids were                            found during retroflexion. The hemorrhoids were                            medium-sized. Complications:            No immediate complications. Estimated Blood Loss:     Estimated blood loss was minimal. Impression:               - Three 2 to 7 mm polyps in the transverse colon,                            in the ascending colon and in the cecum, removed                            with a cold snare. Resected and retrieved.                           - Diverticulosis in the sigmoid colon.                           - Non-bleeding external and internal  hemorrhoids. Recommendation:           - Patient has a contact number available for                            emergencies. The signs and symptoms of potential                            delayed complications were discussed with the                            patient. Return to normal activities tomorrow.                            Written discharge instructions were provided to the                            patient.                           - Resume previous diet.                           - Continue present medications.                           -  Await pathology results.                           - Repeat colonoscopy in 3 - 5 years for                            surveillance based on pathology results. Mauri Pole, MD 01/30/2022 8:37:05 AM This report has been signed electronically.

## 2022-01-30 NOTE — Progress Notes (Signed)
Pt's states no medical or surgical changes since previsit or office visit. 

## 2022-01-30 NOTE — Patient Instructions (Signed)
Handouts on diverticulosis, polyps and hemorrhoids given.     YOU HAD AN ENDOSCOPIC PROCEDURE TODAY AT [facility]:   Refer to the procedure report that was given to you for any specific questions about what was found during the examination.  If the procedure report does not answer your questions, please call your gastroenterologist to clarify.  If you requested that your care partner not be given the details of your procedure findings, then the procedure report has been included in a sealed envelope for you to review at your convenience later.  YOU SHOULD EXPECT: Some feelings of bloating in the abdomen. Passage of more gas than usual.  Walking can help get rid of the air that was put into your GI tract during the procedure and reduce the bloating. If you had a lower endoscopy (such as a colonoscopy or flexible sigmoidoscopy) you may notice spotting of blood in your stool or on the toilet paper. If you underwent a bowel prep for your procedure, you may not have a normal bowel movement for a few days.  Please Note:  You might notice some irritation and congestion in your nose or some drainage.  This is from the oxygen used during your procedure.  There is no need for concern and it should clear up in a day or so.  SYMPTOMS TO REPORT IMMEDIATELY:  Following lower endoscopy (colonoscopy or flexible sigmoidoscopy):  Excessive amounts of blood in the stool  Significant tenderness or worsening of abdominal pains  Swelling of the abdomen that is new, acute  Fever of 100F or higher   For urgent or emergent issues, a gastroenterologist can be reached at any hour by calling [phone]. Do not use MyChart messaging for urgent concerns.    DIET:  We do recommend a small meal at first, but then you may proceed to your regular diet.  Drink plenty of fluids but you should avoid alcoholic beverages for 24 hours.  ACTIVITY:  You should plan to take it easy for the rest of today and  you should NOT DRIVE or use heavy machinery until tomorrow (because of the sedation medicines used during the test).    FOLLOW UP: Our staff will call the number listed on your records the next business day following your procedure.  We will call around 7:15- 8:00 am to check on you and address any questions or concerns that you may have regarding the information given to you following your procedure. If we do not reach you, we will leave a message.  If you develop any symptoms (ie: fever, flu-like symptoms, shortness of breath, cough etc.) before then, please call [phone].  If you test positive for Covid 19 in the 2 weeks post procedure, please call and report this information to [place].    If any biopsies were taken you will be contacted by phone or by letter within the next 1-3 weeks.  Please call us at [phone] if you have not heard about the biopsies in 3 weeks.    SIGNATURES/CONFIDENTIALITY: You and/or your care partner have signed paperwork which will be entered into your electronic medical record.  These signatures attest to the fact that that the information above on your After Visit Summary has been reviewed and is understood.  Full responsibility of the confidentiality of this discharge information lies with you and/or your care-partner.

## 2022-01-30 NOTE — Progress Notes (Signed)
VSS, transported to PACU °

## 2022-01-31 ENCOUNTER — Telehealth: Payer: Self-pay

## 2022-01-31 NOTE — Telephone Encounter (Signed)
Follow up call placed, VM obtained and message left. ?SChaplin, RN,BSN ? ?

## 2022-02-17 ENCOUNTER — Encounter: Payer: Self-pay | Admitting: Gastroenterology

## 2022-02-26 DIAGNOSIS — F5101 Primary insomnia: Secondary | ICD-10-CM | POA: Diagnosis not present

## 2022-02-26 DIAGNOSIS — F3162 Bipolar disorder, current episode mixed, moderate: Secondary | ICD-10-CM | POA: Diagnosis not present

## 2022-02-26 DIAGNOSIS — F4310 Post-traumatic stress disorder, unspecified: Secondary | ICD-10-CM | POA: Diagnosis not present

## 2022-05-26 ENCOUNTER — Ambulatory Visit: Payer: Medicaid Other | Admitting: Family Medicine

## 2022-05-26 VITALS — BP 120/68 | HR 65 | Ht 62.0 in | Wt 139.2 lb

## 2022-05-26 DIAGNOSIS — E785 Hyperlipidemia, unspecified: Secondary | ICD-10-CM | POA: Diagnosis not present

## 2022-05-26 DIAGNOSIS — R1030 Lower abdominal pain, unspecified: Secondary | ICD-10-CM

## 2022-05-26 DIAGNOSIS — Z124 Encounter for screening for malignant neoplasm of cervix: Secondary | ICD-10-CM

## 2022-05-26 LAB — URINALYSIS, ROUTINE W REFLEX MICROSCOPIC
Bacteria, UA: NONE SEEN /HPF
Glucose, UA: NEGATIVE
Hyaline Cast: NONE SEEN /LPF
Leukocytes,Ua: NEGATIVE
Nitrite: NEGATIVE
Specific Gravity, Urine: 1.015 (ref 1.001–1.035)
pH: 7 (ref 5.0–8.0)

## 2022-05-26 LAB — MICROSCOPIC MESSAGE

## 2022-05-26 NOTE — Progress Notes (Signed)
Subjective:    Patient ID: Natalie [name], female    DOB: 02-03-1961, 61 y.o.   MRN: [id number]  She is a very pleasant 61 year old white female here today for complete physical exam.  Past medical history is significant for hyperlipidemia, tobacco abuse, bipolar disorder, and glaucoma.  Patient had a colonoscopy in June that showed polyps and diverticulosis.  She is due to schedule her mammogram.  She is also due for a Pap smear and she request that I do this today.  Patient does complain of a foul odor to her urine.  There is trace amount of blood but negative leukocyte esterase and negative nitrates. Past Medical History:  Diagnosis Date   Allergy    seasonal   Anxiety    bipolar   Bipolar 1 disorder ([place])    Cataract    Depression    Glaucoma    stage 2   History of IBS    IBS- D   Hyperlipidemia    Smoker    Past Surgical History:  Procedure Laterality Date   COLONOSCOPY  2012   Dr. Deatra Ina; 1 tubular adenoma.   COSMETIC SURGERY  08/04/1965   face after accident   [place]  08/05/1991   Current Outpatient Medications on File Prior to Visit  Medication Sig Dispense Refill   lamoTRIgine (LAMICTAL) 100 MG tablet Take 1 tablet (100 mg total) by mouth every morning. 30 tablet 0   Multiple Vitamins-Minerals (CENTRUM SILVER 50+WOMEN PO) Take 1 tablet by mouth daily.     PARoxetine (PAXIL) 20 MG tablet Take 1 tablet (20 mg total) by mouth daily.     Probiotic Product (PROBIOTIC PO) Take by mouth.     propranolol (INDERAL) 10 MG tablet Take 10 mg by mouth as needed.     QUEtiapine (SEROQUEL) 100 MG tablet Take 100 mg by mouth at bedtime.     QUEtiapine (SEROQUEL) 25 MG tablet Take 25 mg by mouth at bedtime.     rosuvastatin (CRESTOR) 20 MG tablet Take 1 tablet (20 mg total) by mouth daily. 90 tablet 3   tretinoin (RETIN-A) 0.025 % cream Apply topically at bedtime. 45 g 5   No current facility-administered medications on file prior  to visit.   Allergies  Allergen Reactions   Sulfa Antibiotics Anaphylaxis and Swelling     flu like sx   Aspirin Other (See Comments)    Has ulcers   Codeine Nausea And Vomiting and Other (See Comments)    Stomach pain   Hydrocodone Nausea And Vomiting   Mobic [Meloxicam] Nausea And Vomiting   Topamax [Topiramate] Other (See Comments)    Dizziness, Weird taste in mouth,sick feeling   Tylenol [Acetaminophen] Other (See Comments)    Has ulcers   Social History   Socioeconomic History   Marital status: Married    Spouse name: Not on file   Number of children: Not on file   Years of education: Not on file   Highest education level: Not on file  Occupational History   Not on file  Tobacco Use   Smoking status: Former    Years: 15.00    Types: Cigarettes   Smokeless tobacco: Never   Tobacco comments:    quit 03/2014 and quit 12-02-2021  Substance and Sexual Activity   Alcohol use: No    Alcohol/week: 0.0 standard drinks of alcohol   Drug use: No   Sexual activity: Not  Currently  Other Topics Concern   Not on file  Social History Narrative   Not on file   Social Determinants of Health   Financial Resource Strain: Not on file  Food Insecurity: Not on file  Transportation Needs: Not on file  Physical Activity: Not on file  Stress: Not on file  Social Connections: Not on file  Intimate Partner Violence: Not on file   Family History  Problem Relation Age of Onset   Colon polyps Father    Hypertension Father    Hyperlipidemia Father    Colon polyps Sister    Colon cancer Maternal Grandfather        dx'd in his 28's   Cancer Neg Hx    Heart disease Neg Hx    Stomach cancer Neg Hx    Rectal cancer Neg Hx       Review of Systems  All other systems reviewed and are negative.      Objective:   Physical Exam Vitals reviewed. Exam conducted with a chaperone present.  Constitutional:      General: She is not in acute distress.    Appearance: She is  well-developed. She is not diaphoretic.  HENT:     Head: Normocephalic and atraumatic.     Right Ear: External ear normal.     Left Ear: External ear normal.     Nose: Nose normal.     Mouth/Throat:     Pharynx: No oropharyngeal exudate.  Eyes:     General: No scleral icterus.       Right eye: No discharge.        Left eye: No discharge.     Conjunctiva/sclera: Conjunctivae normal.     Pupils: Pupils are equal, round, and reactive to light.  Neck:     Thyroid: No thyromegaly.     Vascular: No JVD.     Trachea: No tracheal deviation.  Cardiovascular:     Rate and Rhythm: Normal rate and regular rhythm.     Heart sounds: Normal heart sounds. No murmur heard.    No friction rub. No gallop.  Pulmonary:     Effort: Pulmonary effort is normal. No respiratory distress.     Breath sounds: Normal breath sounds. No wheezing or rales.  Chest:     Chest wall: No tenderness.  Breasts:    Breasts are symmetrical.     Right: No inverted nipple, mass or nipple discharge.     Left: No inverted nipple, mass or nipple discharge.  Abdominal:     General: Bowel sounds are normal. There is no distension.     Palpations: Abdomen is soft. There is no mass.     Tenderness: There is no abdominal tenderness. There is no guarding or rebound.  Genitourinary:    Vagina: Normal.     Cervix: No cervical motion tenderness, friability or erythema.     Uterus: Normal.      Adnexa:        Right: No mass or tenderness.         Left: No mass or tenderness.    Musculoskeletal:     Cervical back: Normal range of motion and neck supple.  Lymphadenopathy:     Cervical: No cervical adenopathy.  Skin:    Coloration: Skin is not pale.     Findings: No erythema or rash.  Neurological:     Mental Status: She is alert and oriented to person, place, and time.     Cranial Nerves:  No cranial nerve deficit.     Motor: No abnormal muscle tone.     Coordination: Coordination normal.     Deep Tendon Reflexes:  Reflexes are normal and symmetric. Reflexes normal.  Psychiatric:        Behavior: Behavior normal.        Thought Content: Thought content normal.        Judgment: Judgment normal.           Assessment & Plan:  Lower abdominal pain - Plan: Urinalysis, Routine w reflex microscopic  Hyperlipidemia, unspecified hyperlipidemia type - Plan: CBC with Differential/Platelet, COMPLETE METABOLIC PANEL WITH GFR, Lipid panel  Cervical cancer screening - Plan: PAP, Thin Prep w/HPV rflx HPV Type 16/18 Urinalysis shows trace blood.  I recommended repeating this in 2 weeks.  If hematuria persist I would recommend urology consultation for cystoscopy however I believe this most likely benign.  Check CBC CMP and lipid panel due to her history of hyperlipidemia.  Patient received her flu shot today.  Pap smear was performed and sent to pathology and labeled container.  Recommended she schedule her mammogram.

## 2022-05-27 LAB — CBC WITH DIFFERENTIAL/PLATELET
Absolute Monocytes: 458 cells/uL (ref 200–950)
Basophils Absolute: 40 cells/uL (ref 0–200)
Basophils Relative: 0.5 %
Eosinophils Absolute: 363 cells/uL (ref 15–500)
Eosinophils Relative: 4.6 %
HCT: 40.8 % (ref 35.0–45.0)
Hemoglobin: 13.1 g/dL (ref 11.7–15.5)
Lymphs Abs: 3018 cells/uL (ref 850–3900)
MCH: 31.6 pg (ref 27.0–33.0)
MCHC: 32.1 g/dL (ref 32.0–36.0)
MCV: 98.3 fL (ref 80.0–100.0)
MPV: 10 fL (ref 7.5–12.5)
Monocytes Relative: 5.8 %
Neutro Abs: 4021 cells/uL (ref 1500–7800)
Neutrophils Relative %: 50.9 %
Platelets: 211 10*3/uL (ref 140–400)
RBC: 4.15 10*6/uL (ref 3.80–5.10)
RDW: 12.7 % (ref 11.0–15.0)
Total Lymphocyte: 38.2 %
WBC: 7.9 10*3/uL (ref 3.8–10.8)

## 2022-05-27 LAB — COMPLETE METABOLIC PANEL WITH GFR
AG Ratio: 1.8 (calc) (ref 1.0–2.5)
ALT: 22 U/L (ref 6–29)
AST: 25 U/L (ref 10–35)
Albumin: 4.6 g/dL (ref 3.6–5.1)
Alkaline phosphatase (APISO): 75 U/L (ref 37–153)
BUN: 8 mg/dL (ref 7–25)
CO2: 28 mmol/L (ref 20–32)
Calcium: 9.1 mg/dL (ref 8.6–10.4)
Chloride: 102 mmol/L (ref 98–110)
Creat: 0.83 mg/dL (ref 0.50–1.05)
Globulin: 2.5 g/dL (calc) (ref 1.9–3.7)
Glucose, Bld: 89 mg/dL (ref 65–99)
Potassium: 4.3 mmol/L (ref 3.5–5.3)
Sodium: 138 mmol/L (ref 135–146)
Total Bilirubin: 0.4 mg/dL (ref 0.2–1.2)
Total Protein: 7.1 g/dL (ref 6.1–8.1)
eGFR: 79 mL/min/{1.73_m2} (ref >=60)

## 2022-05-27 LAB — LIPID PANEL
Cholesterol: 131 mg/dL (ref <200)
HDL: 63 mg/dL (ref >=50)
LDL Cholesterol (Calc): 45 mg/dL (calc)
Non-HDL Cholesterol (Calc): 68 mg/dL (calc) (ref <130)
Total CHOL/HDL Ratio: 2.1 (calc) (ref <5.0)
Triglycerides: 149 mg/dL (ref <150)

## 2022-05-27 LAB — PAP, TP IMAGING W/ HPV RNA, RFLX HPV TYPE 16,18/45: HPV DNA High Risk: NOT DETECTED

## 2022-05-29 ENCOUNTER — Other Ambulatory Visit: Payer: Self-pay | Admitting: Gastroenterology

## 2022-05-29 DIAGNOSIS — Z86010 Personal history of colonic polyps: Secondary | ICD-10-CM

## 2022-10-16 ENCOUNTER — Telehealth: Payer: Self-pay

## 2022-10-16 NOTE — Telephone Encounter (Signed)
PA FOR EMGALITY:  your request has been approved PA Case: [id number], Status: Approved, Coverage Starts on: 10/16/2022 12:00:00 AM, Coverage Ends on: 10/16/2023 12:00:00 AM.

## 2022-10-17 NOTE — Telephone Encounter (Signed)
Fax received from [place], pt's Emgality has been approved. Mjp,lpn

## 2023-02-02 ENCOUNTER — Ambulatory Visit: Payer: Medicaid Other | Admitting: Family Medicine

## 2023-02-02 VITALS — BP 110/78 | HR 71 | Temp 98.5°F | Ht 62.0 in | Wt 154.0 lb

## 2023-02-02 DIAGNOSIS — N393 Stress incontinence (female) (male): Secondary | ICD-10-CM | POA: Diagnosis not present

## 2023-02-02 MED ORDER — ALBUTEROL SULFATE HFA 108 (90 BASE) MCG/ACT IN AERS
2.0000 | INHALATION_SPRAY | Freq: Four times a day (QID) | RESPIRATORY_TRACT | 3 refills | Status: DC | PRN
Start: 2023-02-02 — End: 2023-05-20

## 2023-02-02 MED ORDER — ALBUTEROL SULFATE HFA 108 (90 BASE) MCG/ACT IN AERS: 2.0000 | INHALATION_SPRAY | Freq: Four times a day (QID) | RESPIRATORY_TRACT | 0 refills | Status: DC | PRN

## 2023-02-02 NOTE — Progress Notes (Signed)
Subjective:    Patient ID: Natalie [name], female    DOB: 1960/09/20, 62 y.o.   MRN: [id number]  Patient has a past medical history of 2 vaginal deliveries.  She states over the last year she has developed stress incontinence.  She states that if she laughs, coughs, or sneezes, jumps.  Urine leakage patient denying urge continence.  Denies any sudden onset of urge or being able to make it to the bathroom in time.  All of her symptoms sound like stress incontinence.  On pelvic exam today she has a normal-appearing introitus.  There is no significant prolapse of the bladder into the vagina.  I asked the patient to call and there was no urine leakage from the urethra. Past Medical History:  Diagnosis Date   Allergy    seasonal   Anxiety    bipolar   Bipolar 1 disorder (HCC)    Cataract    Depression    Glaucoma    stage 2   History of IBS    IBS- D   Hyperlipidemia    Smoker    Past Surgical History:  Procedure Laterality Date   COLONOSCOPY  2012   Dr. Arlyce Dice; 1 tubular adenoma.   COSMETIC SURGERY  08/04/1965   face after accident   EYELID LACERATION REPAIR     POLYPECTOMY     TUBAL LIGATION  08/05/1991   Current Outpatient Medications on File Prior to Visit  Medication Sig Dispense Refill   lamoTRIgine (LAMICTAL) 100 MG tablet Take 1 tablet (100 mg total) by mouth every morning. 30 tablet 0   Multiple Vitamins-Minerals (CENTRUM SILVER 50+WOMEN PO) Take 1 tablet by mouth daily.     PARoxetine (PAXIL) 20 MG tablet Take 1 tablet (20 mg total) by mouth daily.     Probiotic Product (PROBIOTIC PO) Take by mouth.     propranolol (INDERAL) 10 MG tablet Take 10 mg by mouth as needed.     QUEtiapine (SEROQUEL) 100 MG tablet Take 100 mg by mouth at bedtime.     QUEtiapine (SEROQUEL) 25 MG tablet Take 25 mg by mouth at bedtime.     rosuvastatin (CRESTOR) 20 MG tablet Take 1 tablet (20 mg total) by mouth daily. 90 tablet 3   tretinoin (RETIN-A) 0.025 % cream Apply topically at bedtime. 45 g 5    No current facility-administered medications on file prior to visit.   Allergies  Allergen Reactions   Sulfa Antibiotics Anaphylaxis and Swelling     flu like sx   Aspirin Other (See Comments)    Has ulcers   Codeine Nausea And Vomiting and Other (See Comments)    Stomach pain   Hydrocodone Nausea And Vomiting   Mobic [Meloxicam] Nausea And Vomiting   Topamax [Topiramate] Other (See Comments)    Dizziness, Weird taste in mouth,sick feeling   Tylenol [Acetaminophen] Other (See Comments)    Has ulcers   Social History   Socioeconomic History   Marital status: Married    Spouse name: Not on file   Number of children: Not on file   Years of education: Not on file   Highest education level: Not on file  Occupational History   Not on file  Tobacco Use   Smoking status: Former    Years: 15    Types: Cigarettes   Smokeless tobacco: Never   Tobacco comments:    quit 03/2014 and quit 12-02-2021  Substance and Sexual Activity   Alcohol use: No    Alcohol/week:  0.0 standard drinks of alcohol   Drug use: No   Sexual activity: Not Currently  Other Topics Concern   Not on file  Social History Narrative   Not on file   Social Determinants of Health   Financial Resource Strain: Not on file  Food Insecurity: Not on file  Transportation Needs: Not on file  Physical Activity: Not on file  Stress: Not on file  Social Connections: Not on file  Intimate Partner Violence: Not on file   Family History  Problem Relation Age of Onset   Colon polyps Father    Hypertension Father    Hyperlipidemia Father    Colon polyps Sister    Colon cancer Maternal Grandfather        dx'd in his 20's   Cancer Neg Hx    Heart disease Neg Hx    Stomach cancer Neg Hx    Rectal cancer Neg Hx       Review of Systems  All other systems reviewed and are negative.      Objective:   Physical Exam Vitals reviewed. Exam conducted with a chaperone present.  Constitutional:      General: She  is not in acute distress.    Appearance: She is well-developed. She is not diaphoretic.  HENT:     Head: Normocephalic and atraumatic.     Right Ear: External ear normal.     Left Ear: External ear normal.     Nose: Nose normal.     Mouth/Throat:     Pharynx: No oropharyngeal exudate.  Eyes:     General: No scleral icterus.       Right eye: No discharge.        Left eye: No discharge.     Conjunctiva/sclera: Conjunctivae normal.     Pupils: Pupils are equal, round, and reactive to light.  Neck:     Thyroid: No thyromegaly.     Vascular: No JVD.     Trachea: No tracheal deviation.  Cardiovascular:     Rate and Rhythm: Normal rate and regular rhythm.     Heart sounds: Normal heart sounds. No murmur heard.    No friction rub. No gallop.  Pulmonary:     Effort: Pulmonary effort is normal. No respiratory distress.     Breath sounds: Normal breath sounds. No wheezing or rales.  Chest:     Chest wall: No tenderness.  Breasts:    Breasts are symmetrical.     Right: No inverted nipple, mass or nipple discharge.     Left: No inverted nipple, mass or nipple discharge.  Abdominal:     General: Bowel sounds are normal. There is no distension.     Palpations: Abdomen is soft. There is no mass.     Tenderness: There is no abdominal tenderness. There is no guarding or rebound.  Genitourinary:    General: Normal vulva.     Exam position: Lithotomy position.     Pubic Area: No rash.      Labia:        Right: No rash.        Left: No rash.      Urethra: No prolapse, urethral pain, urethral swelling or urethral lesion.     Vagina: Normal. No prolapsed vaginal walls.     Uterus: Normal.      Adnexa:        Right: No mass or tenderness.         Left: No mass or  tenderness.    Musculoskeletal:     Cervical back: Normal range of motion and neck supple.  Lymphadenopathy:     Cervical: No cervical adenopathy.  Skin:    Coloration: Skin is not pale.     Findings: No erythema or rash.   Neurological:     Mental Status: She is alert and oriented to person, place, and time.     Cranial Nerves: No cranial nerve deficit.     Motor: No abnormal muscle tone.     Coordination: Coordination normal.     Deep Tendon Reflexes: Reflexes are normal and symmetric. Reflexes normal.  Psychiatric:        Behavior: Behavior normal.        Thought Content: Thought content normal.        Judgment: Judgment normal.           Assessment & Plan:  Stress incontinence in female We discussed bladder training, Kegel exercises, and surgical options for stress incontinence.  She would like a referral to urogynecology to discuss these options further as she has been trying Kegel exercises without any benefit.

## 2023-02-03 IMAGING — US US ABDOMEN LIMITED RUQ/ASCITES
1 series · 14 of 25 positions shown · non-contrast
Comparison: None.

CLINICAL DATA: Elevated LFTs

EXAM:
ULTRASOUND ABDOMEN LIMITED RIGHT UPPER QUADRANT

[Series 1: us abdomen limited ruq (liver/gb) · 14 of 59 slices shown]
[im 1/59]
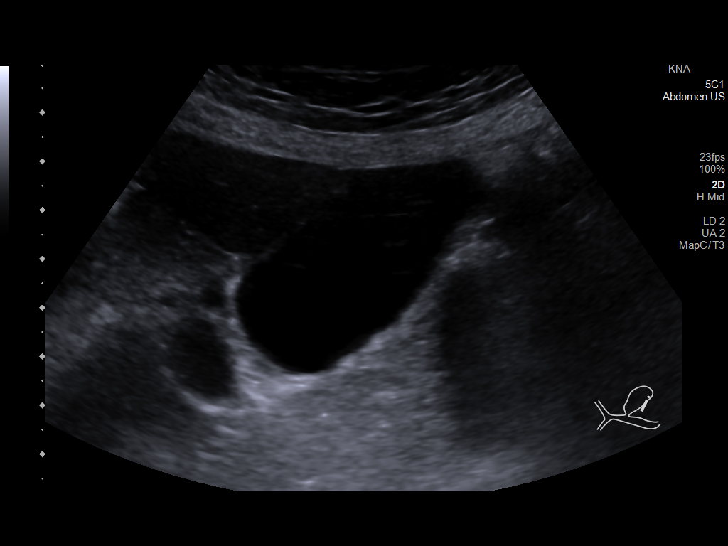
[im 5/59]
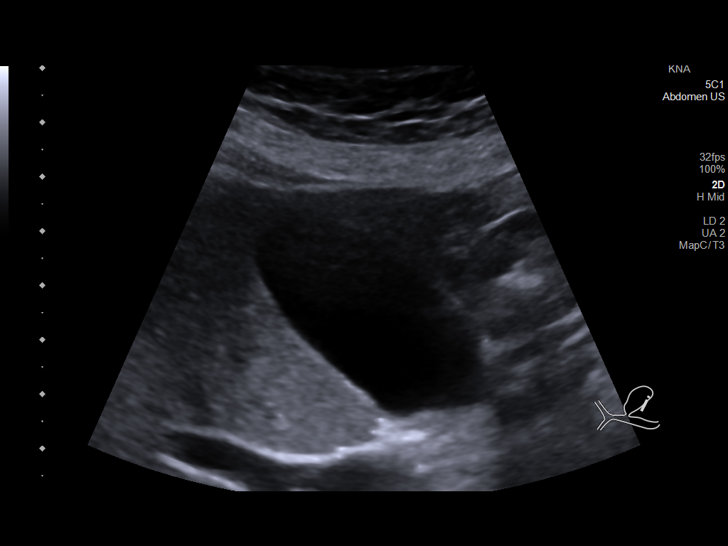
[im 10/59]
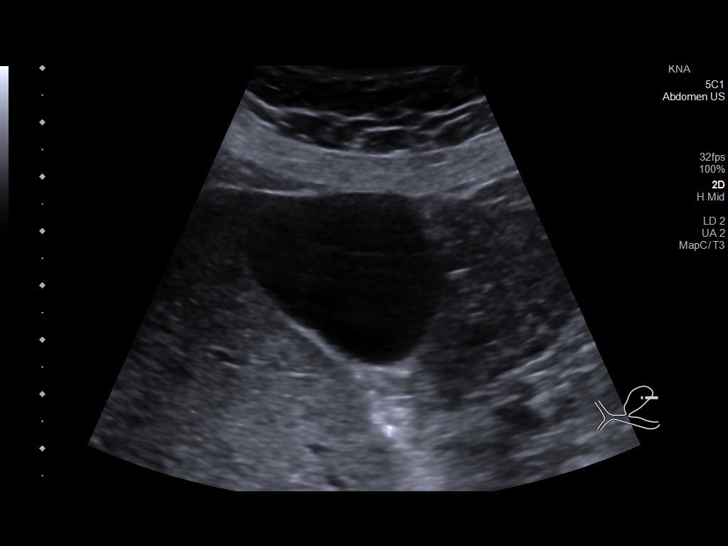
[im 15/59]
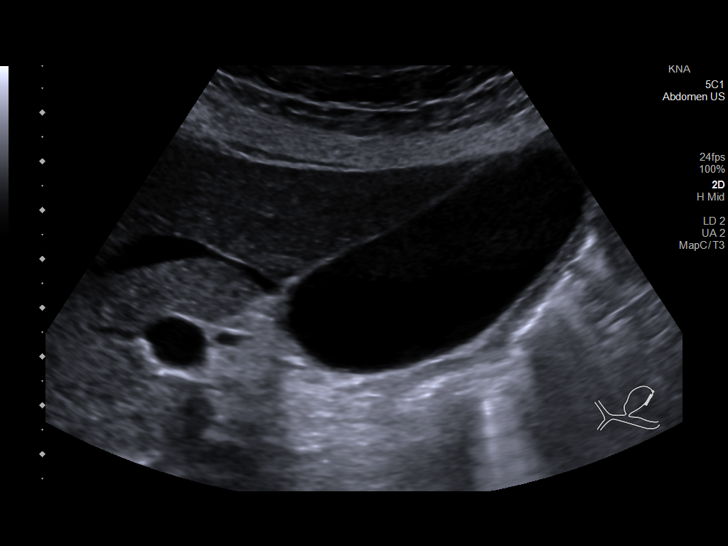
[im 20/59]
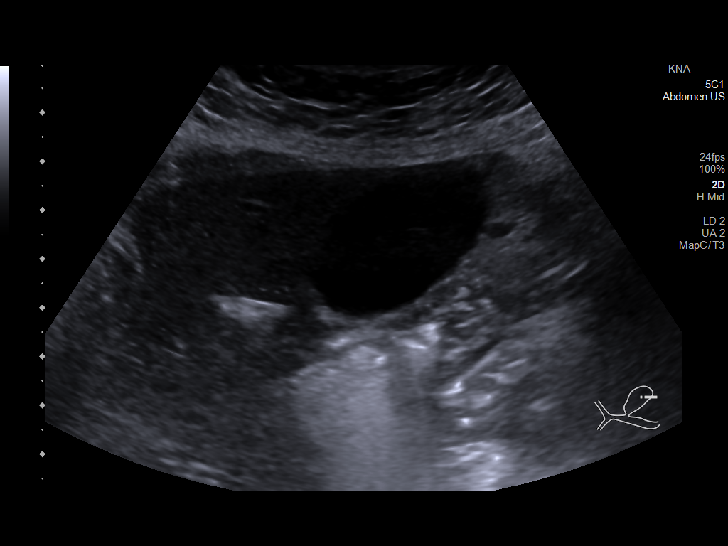
[im 22/59]
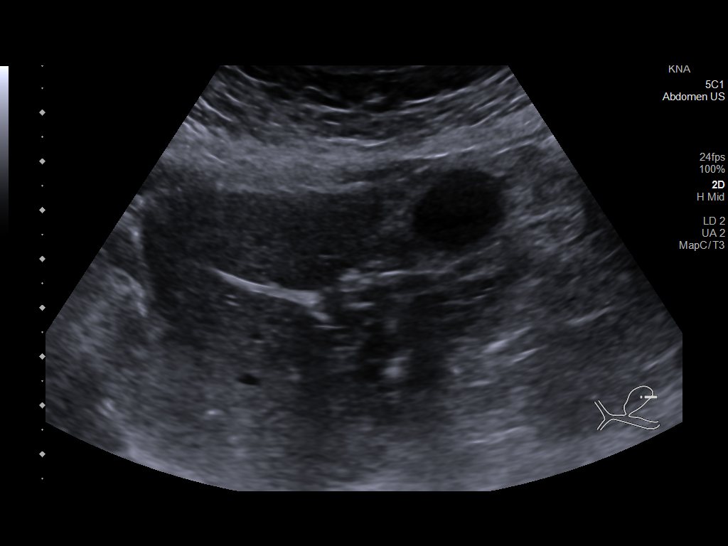
[im 27/59]
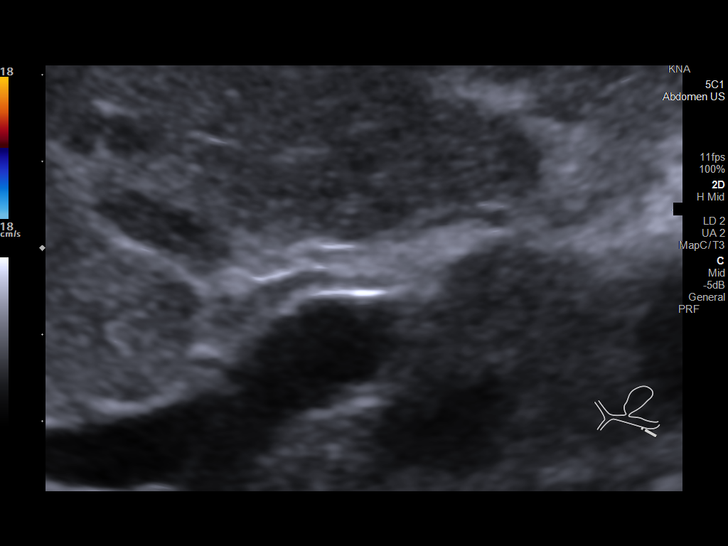
[im 32/59]
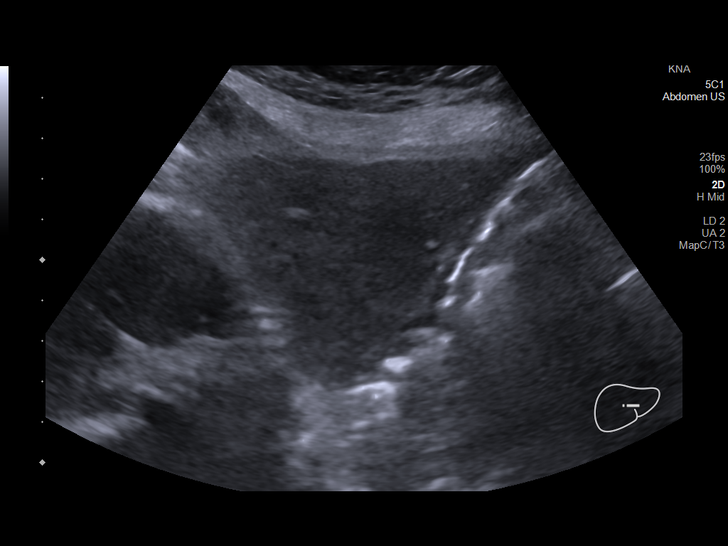
[im 37/59]
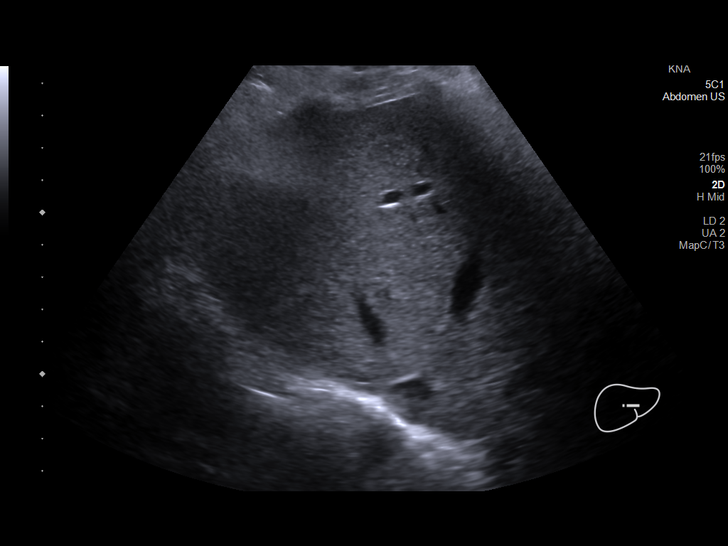
[im 39/59]
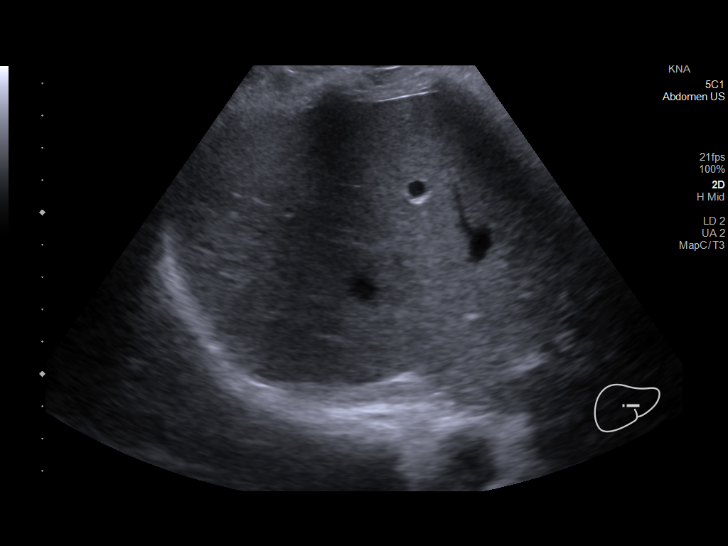
[im 44/59]
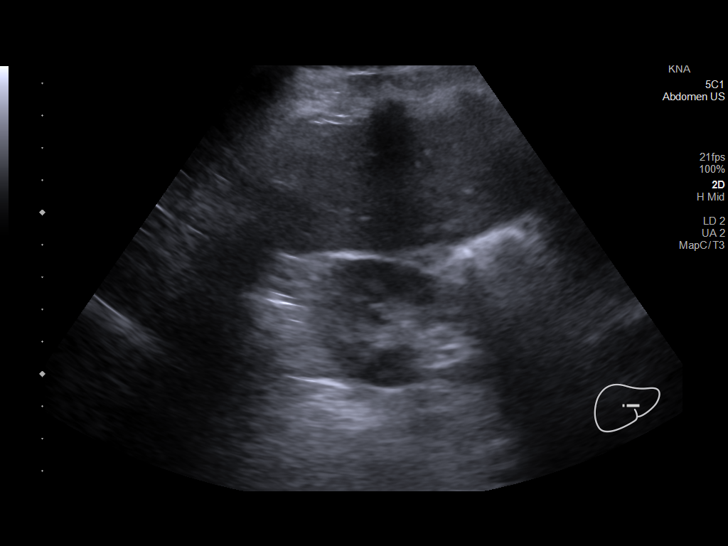
[im 49/59]
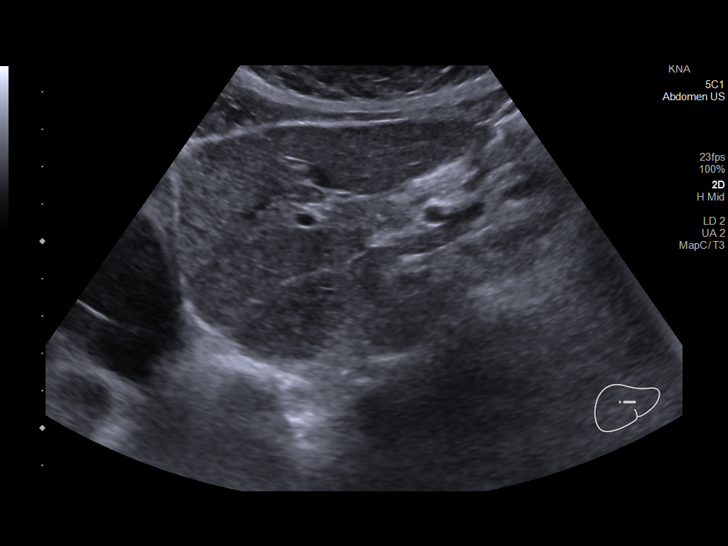
[im 54/59]
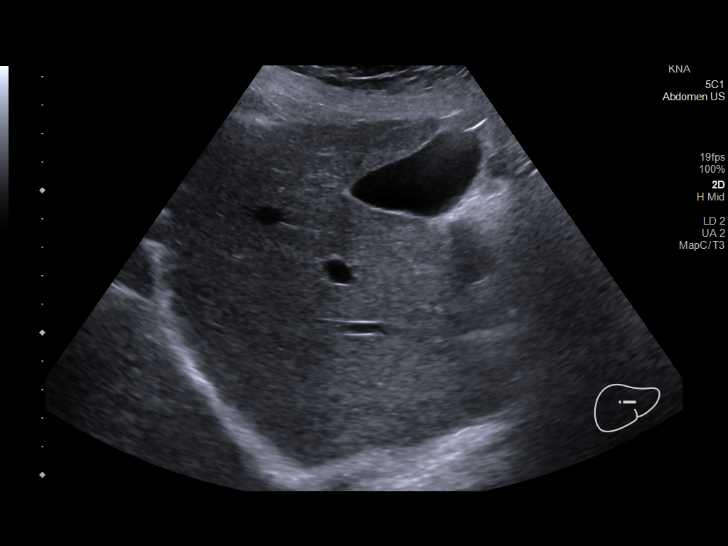
[im 59/59]
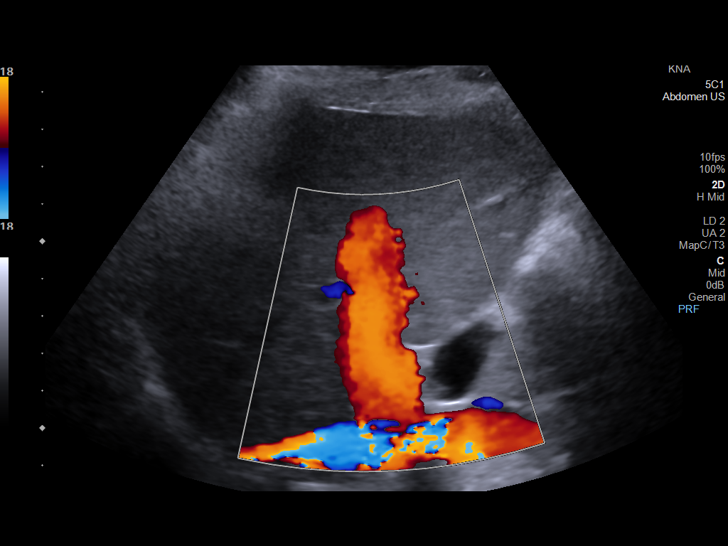

[14 of 25 positions shown; findings below may reference images not displayed]

FINDINGS: Gallbladder:

No gallstones or wall thickening visualized. No sonographic Murphy
sign noted by sonographer.

Common bile duct:

Diameter: 3.5 mm.

Liver:

No focal lesion identified. Within normal limits in parenchymal
echogenicity. Portal vein is patent on color Doppler imaging with
normal direction of blood flow towards the liver.

Other: None.
IMPRESSION: No acute abnormality noted.

## 2023-02-15 ENCOUNTER — Other Ambulatory Visit: Payer: Self-pay | Admitting: Family Medicine

## 2023-02-15 DIAGNOSIS — E7800 Pure hypercholesterolemia, unspecified: Secondary | ICD-10-CM

## 2023-03-13 ENCOUNTER — Ambulatory Visit (INDEPENDENT_AMBULATORY_CARE_PROVIDER_SITE_OTHER): Payer: Medicaid Other | Admitting: Family Medicine

## 2023-03-13 VITALS — BP 120/72 | HR 85 | Temp 97.7°F | Resp 16 | Wt 155.0 lb

## 2023-03-13 DIAGNOSIS — J208 Acute bronchitis due to other specified organisms: Secondary | ICD-10-CM | POA: Diagnosis not present

## 2023-03-13 NOTE — Progress Notes (Signed)
Subjective:    Patient ID: Natalie [name], female    DOB: 09-Jul-1961, 62 y.o.   MRN: [id number]  Patient reports a cough x 2 weeks.  Cough is nonproductive.  Symptoms started as a cold last week with head congestion and rhinorrhea.  The head congestion and rhinorrhea have improved.  She denies any fever however she is now wheezing and coughing frequently. Past Medical History:  Diagnosis Date   Allergy    seasonal   Anxiety    bipolar   Bipolar 1 disorder (HCC)    Cataract    Depression    Glaucoma    stage 2   History of IBS    IBS- D   Hyperlipidemia    Smoker    Past Surgical History:  Procedure Laterality Date   COLONOSCOPY  2012   Dr. Arlyce Dice; 1 tubular adenoma.   COSMETIC SURGERY  08/04/1965   face after accident   EYELID LACERATION REPAIR     POLYPECTOMY     TUBAL LIGATION  08/05/1991   Current Outpatient Medications on File Prior to Visit  Medication Sig Dispense Refill   albuterol (VENTOLIN HFA) 108 (90 Base) MCG/ACT inhaler Inhale 2 puffs into the lungs every 6 (six) hours as needed for wheezing or shortness of breath. 8 g 3   lamoTRIgine (LAMICTAL) 100 MG tablet Take 1 tablet (100 mg total) by mouth every morning. 30 tablet 0   Multiple Vitamins-Minerals (CENTRUM SILVER 50+WOMEN PO) Take 1 tablet by mouth daily.     PARoxetine (PAXIL) 20 MG tablet Take 1 tablet (20 mg total) by mouth daily.     Probiotic Product (PROBIOTIC PO) Take by mouth.     propranolol (INDERAL) 10 MG tablet Take 10 mg by mouth as needed.     QUEtiapine (SEROQUEL) 100 MG tablet Take 100 mg by mouth at bedtime.     QUEtiapine (SEROQUEL) 25 MG tablet Take 25 mg by mouth at bedtime.     rosuvastatin (CRESTOR) 20 MG tablet TAKE 1 TABLET BY MOUTH EVERY DAY 30 tablet 0   tretinoin (RETIN-A) 0.025 % cream Apply topically at bedtime. 45 g 5   No current facility-administered medications on file prior to visit.   Allergies  Allergen Reactions   Sulfa Antibiotics Anaphylaxis and Swelling     flu  like sx   Aspirin Other (See Comments)    Has ulcers   Codeine Nausea And Vomiting and Other (See Comments)    Stomach pain   Hydrocodone Nausea And Vomiting   Mobic [Meloxicam] Nausea And Vomiting   Topamax [Topiramate] Other (See Comments)    Dizziness, Weird taste in mouth,sick feeling   Tylenol [Acetaminophen] Other (See Comments)    Has ulcers   Social History   Socioeconomic History   Marital status: Married    Spouse name: Not on file   Number of children: Not on file   Years of education: Not on file   Highest education level: Not on file  Occupational History   Not on file  Tobacco Use   Smoking status: Former    Types: Cigarettes   Smokeless tobacco: Never   Tobacco comments:    quit 03/2014 and quit 12-02-2021  Substance and Sexual Activity   Alcohol use: No    Alcohol/week: 0.0 standard drinks of alcohol   Drug use: No   Sexual activity: Not Currently  Other Topics Concern   Not on file  Social History Narrative   Not on file  Social Determinants of Health   Financial Resource Strain: Not on file  Food Insecurity: Not on file  Transportation Needs: Not on file  Physical Activity: Not on file  Stress: Not on file  Social Connections: Not on file  Intimate Partner Violence: Not on file   Family History  Problem Relation Age of Onset   Colon polyps Father    Hypertension Father    Hyperlipidemia Father    Colon polyps Sister    Colon cancer Maternal Grandfather        dx'd in his 50's   Cancer Neg Hx    Heart disease Neg Hx    Stomach cancer Neg Hx    Rectal cancer Neg Hx       Review of Systems  All other systems reviewed and are negative.      Objective:   Physical Exam Vitals reviewed. Exam conducted with a chaperone present.  Constitutional:      General: She is not in acute distress.    Appearance: She is well-developed. She is not diaphoretic.  HENT:     Head: Normocephalic and atraumatic.     Right Ear: External ear normal.      Left Ear: External ear normal.     Nose: Nose normal.     Mouth/Throat:     Pharynx: No oropharyngeal exudate.  Eyes:     General: No scleral icterus.       Right eye: No discharge.        Left eye: No discharge.     Conjunctiva/sclera: Conjunctivae normal.     Pupils: Pupils are equal, round, and reactive to light.  Neck:     Thyroid: No thyromegaly.     Vascular: No JVD.     Trachea: No tracheal deviation.  Cardiovascular:     Rate and Rhythm: Normal rate and regular rhythm.     Heart sounds: Normal heart sounds. No murmur heard.    No friction rub. No gallop.  Pulmonary:     Effort: Pulmonary effort is normal. No respiratory distress.     Breath sounds: Wheezing present. No rales.  Chest:     Chest wall: No tenderness.  Breasts:    Breasts are symmetrical.     Right: No inverted nipple, mass or nipple discharge.     Left: No inverted nipple, mass or nipple discharge.  Genitourinary:    General: Normal vulva.     Exam position: Lithotomy position.     Pubic Area: No rash.      Labia:        Right: No rash.        Left: No rash.      Urethra: No prolapse, urethral pain, urethral swelling or urethral lesion.     Vagina: Normal. No prolapsed vaginal walls.     Uterus: Normal.      Adnexa:        Right: No mass or tenderness.         Left: No mass or tenderness.    Musculoskeletal:     Cervical back: Normal range of motion and neck supple.  Lymphadenopathy:     Cervical: No cervical adenopathy.  Skin:    Coloration: Skin is not pale.     Findings: No erythema or rash.  Neurological:     General: No focal deficit present.     Mental Status: She is alert and oriented to person, place, and time. Mental status is at baseline.  Cranial Nerves: No cranial nerve deficit.     Motor: No abnormal muscle tone.     Deep Tendon Reflexes: Reflexes are normal and symmetric.           Assessment & Plan:  Viral bronchitis I gave the patient a prescription for a  prednisone taper pack due to the wheezing and bronchospasm and added Hycodan 1 teaspoon every 6 hours as needed for coughing.  Anticipate symptoms should improve over the weekend.

## 2023-03-17 ENCOUNTER — Ambulatory Visit
Admission: EM | Admit: 2023-03-17 | Discharge: 2023-03-17 | Disposition: A | Discharge status: Home / Self Care | Payer: Medicaid Other | Attending: Nurse Practitioner | Admitting: Nurse Practitioner

## 2023-03-17 ENCOUNTER — Ambulatory Visit: Payer: Medicaid Other

## 2023-03-17 DIAGNOSIS — S8392XA Sprain of unspecified site of left knee, initial encounter: Secondary | ICD-10-CM

## 2023-03-17 DIAGNOSIS — M25562 Pain in left knee: Secondary | ICD-10-CM | POA: Diagnosis not present

## 2023-03-17 NOTE — ED Triage Notes (Signed)
Pt c/o  left knee pain after fall while push mowing. Pt states she was cutting a corner and her knee gave way and her leg was beside out  her. Occurred yesterday. Swelling and redness  to the sight. Hurts to move the leg hurts more towards the inside of the thigh.

## 2023-03-17 NOTE — ED Provider Notes (Signed)
[facility]    CSN: [id number] Arrival date & time: 03/17/23  1204      History   Chief Complaint No chief complaint on file.   HPI Natalie [name] is a 62 y.o. female.   The history is provided by the patient.   Patient presents for complaints of left knee pain.  Patient states she was mowing her yard 1 day ago when she slipped, and her left leg went underneath her.  She states since that time, she has had pain to the inside of the left knee with swelling.  Pain worsens with bending the knee.  She denies radiation of pain, numbness, tingling, or inability to bear weight.  Patient denies prior injury to the left knee.  She reports she did take an old prescription of hydrocodone cough medicine for pain and has been icing the knee.  Past Medical History:  Diagnosis Date   Allergy    seasonal   Anxiety    bipolar   Bipolar 1 disorder (HCC)    Cataract    Depression    Glaucoma    stage 2   History of IBS    IBS- D   Hyperlipidemia    Smoker     Patient Active Problem List   Diagnosis Date Noted   Diarrhea 01/14/2021   Elevated LFTs 01/14/2021   H/O adenomatous polyp of colon 01/14/2021   Colon cancer screening 01/08/2014   Confusion 01/08/2014   Bipolar disorder with severe mania (HCC) 01/08/2014   Allergy    Anxiety    Depression    Smoker 10/20/2013   Bipolar disorder, unspecified (HCC) 10/20/2013   Glaucoma 10/20/2013   Hyperlipidemia     Past Surgical History:  Procedure Laterality Date   COLONOSCOPY  2012   Dr. Arlyce Dice; 1 tubular adenoma.   COSMETIC SURGERY  08/04/1965   face after accident   EYELID LACERATION REPAIR     POLYPECTOMY     TUBAL LIGATION  08/05/1991    OB History   No obstetric history on file.      Home Medications    Prior to Admission medications   Medication Sig Start Date End Date Taking? Authorizing Provider  albuterol (VENTOLIN HFA) 108 (90 Base) MCG/ACT inhaler Inhale 2 puffs into the lungs every 6 (six) hours  as needed for wheezing or shortness of breath. 02/02/23   Donita Brooks, MD  lamoTRIgine (LAMICTAL) 100 MG tablet Take 1 tablet (100 mg total) by mouth every morning. 03/16/14   Withrow, Everardo All, FNP  Multiple Vitamins-Minerals (CENTRUM SILVER 50+WOMEN PO) Take 1 tablet by mouth daily.    [provider]  PARoxetine (PAXIL) 20 MG tablet Take 1 tablet (20 mg total) by mouth daily. 03/16/14   Withrow, Everardo All, FNP  Probiotic Product (PROBIOTIC PO) Take by mouth.    [provider]  propranolol (INDERAL) 10 MG tablet Take 10 mg by mouth as needed.    [provider]  QUEtiapine (SEROQUEL) 100 MG tablet Take 100 mg by mouth at bedtime. 08/28/21   [provider]  QUEtiapine (SEROQUEL) 25 MG tablet Take 25 mg by mouth at bedtime. 05/21/21   [provider]  rosuvastatin (CRESTOR) 20 MG tablet TAKE 1 TABLET BY MOUTH EVERY DAY 02/16/23   Donita Brooks, MD  tretinoin (RETIN-A) 0.025 % cream Apply topically at bedtime. 07/13/20   Donita Brooks, MD    Family History Family History  Problem Relation Age of Onset  Colon polyps Father    Hypertension Father    Hyperlipidemia Father    Colon polyps Sister    Colon cancer Maternal Grandfather        dx'd in his 22's   Cancer Neg Hx    Heart disease Neg Hx    Stomach cancer Neg Hx    Rectal cancer Neg Hx     Social History Social History   Tobacco Use   Smoking status: Former    Types: Cigarettes   Smokeless tobacco: Never   Tobacco comments:    quit 03/2014 and quit 12-02-2021  Substance Use Topics   Alcohol use: No    Alcohol/week: 0.0 standard drinks of alcohol   Drug use: No     Allergies   Sulfa antibiotics, Aspirin, Codeine, Hydrocodone, Mobic [meloxicam], Topamax [topiramate], and Tylenol [acetaminophen]   Review of Systems Review of Systems Per HPI  Physical Exam Triage Vital Signs ED Triage Vitals  Encounter Vitals Group     BP 03/17/23 1253 127/75     Systolic BP  Percentile --      Diastolic BP Percentile --      Pulse Rate 03/17/23 1253 (!) 57     Resp 03/17/23 1253 12     Temp 03/17/23 1253 98.5 F (36.9 C)     Temp Source 03/17/23 1253 Oral     SpO2 03/17/23 1253 94 %     Weight --      Height --      Head Circumference --      Peak Flow --      Pain Score 03/17/23 1254 10     Pain Loc --      Pain Education --      Exclude from Growth Chart --    No data found.  Updated Vital Signs BP 127/75 (BP Location: Right Arm)   Pulse (!) 57   Temp 98.5 F (36.9 C) (Oral)   Resp 12   LMP 11/16/2009   SpO2 94%   Visual Acuity Right Eye Distance:   Left Eye Distance:   Bilateral Distance:    Right Eye Near:   Left Eye Near:    Bilateral Near:     Physical Exam Vitals and nursing note reviewed.  Constitutional:      General: She is not in acute distress.    Appearance: Normal appearance.  HENT:     Head: Normocephalic.  Eyes:     Extraocular Movements: Extraocular movements intact.     Pupils: Pupils are equal, round, and reactive to light.  Pulmonary:     Effort: Pulmonary effort is normal.  Musculoskeletal:     Cervical back: Normal range of motion.     Left knee: Effusion (Effusion noted to the medial aspect of the knee from the patella down to the tibia.) present. No deformity or ecchymosis. Decreased range of motion. Tenderness present over the medial joint line. Normal pulse.  Skin:    General: Skin is warm and dry.  Neurological:     General: No focal deficit present.     Mental Status: She is alert and oriented to person, place, and time.  Psychiatric:        Mood and Affect: Mood normal.        Behavior: Behavior normal.      UC Treatments / Results  Labs (all labs ordered are listed, but only abnormal results are displayed) Labs Reviewed - No data to display  EKG  Radiology DG Knee Complete 4 Views Left  Result Date: 03/17/2023 CLINICAL DATA:  Pain after fall EXAM: LEFT KNEE - COMPLETE 4 VIEW  COMPARISON:  None Available. FINDINGS: Motion on the frontal view limits evaluation. Otherwise no definite fracture or dislocation. Osteopenia. Preserved joint spaces. Trace joint effusion. IMPRESSION: Limited frontal view. Osteopenia.  Trace joint effusion on lateral view Electronically Signed   By: Karen Kays M.D.   On: 03/17/2023 13:28    Procedures Procedures (including critical care time)  Medications Ordered in UC Medications - No data to display  Initial Impression / Assessment and Plan / UC Course  I have reviewed the triage vital signs and the nursing notes.  Pertinent labs & imaging results that were available during my care of the patient were reviewed by me and considered in my medical decision making (see chart for details).  The patient is well-appearing, she is in no acute distress, vital signs are stable.  X-ray of the left knee is negative for fracture or dislocation.  There is a joint effusion seen on the x-ray, consistent with the patient's exam.  Symptoms appear to be consistent with a sprain/strain of the left knee.  Knee brace was provided to the patient to provide compression and support.  Supportive care recommendations were provided and discussed with the patient to include RICE therapy, over-the-counter analgesics for pain or discomfort, and gentle range of motion exercises.  Patient was advised that if symptoms are not improving over the next 2 to 3 weeks, it is recommended that she follow-up with orthopedics for further evaluation.  Patient was given information for [facility] and for [facility].  Patient is in agreement with this plan of care and verbalizes understanding.  All questions were answered.  Patient stable for discharge.  Final Clinical Impressions(s) / UC Diagnoses   Final diagnoses:  Sprain of left knee, unspecified ligament, initial encounter     Discharge Instructions      X-ray of the knee is negative for fracture or  dislocation.  There is fluid on the knee as discussed in your exam.  Symptoms appear to be consistent with a sprain/strain of the knee. A knee brace/sleeve has been provided to provide compression and support for your knee. May take over-the-counter Tylenol arthritis strength 650 mg tablets as needed for pain or discomfort. RICE therapy, rest, ice, compression, and elevation.  Apply ice for 20 minutes, remove for 1 hour, repeat as much as possible. Gentle range of motion exercises of the left knee to help improve joint mobility.  I have provided exercises for you to perform as tolerated.  Try to perform at least 2-3 times daily. If symptoms are not improving over the next 2 to 3 weeks, it is recommended that you follow-up with orthopedics for further evaluation.  You can follow-up with [facility] or with [facility]. Follow-up as needed.      ED Prescriptions   None    PDMP not reviewed this encounter.   Abran Cantor, NP 03/17/23 1348

## 2023-03-17 NOTE — Discharge Instructions (Addendum)
X-ray of the knee is negative for fracture or dislocation.  There is fluid on the knee as discussed in your exam.  Symptoms appear to be consistent with a sprain/strain of the knee. A knee brace/sleeve has been provided to provide compression and support for your knee. May take over-the-counter Tylenol arthritis strength 650 mg tablets as needed for pain or discomfort. RICE therapy, rest, ice, compression, and elevation.  Apply ice for 20 minutes, remove for 1 hour, repeat as much as possible. Gentle range of motion exercises of the left knee to help improve joint mobility.  I have provided exercises for you to perform as tolerated.  Try to perform at least 2-3 times daily. If symptoms are not improving over the next 2 to 3 weeks, it is recommended that you follow-up with orthopedics for further evaluation.  You can follow-up with [facility] or with [facility]. Follow-up as needed.

## 2023-03-18 ENCOUNTER — Other Ambulatory Visit: Payer: Self-pay | Admitting: Family Medicine

## 2023-03-18 DIAGNOSIS — E7800 Pure hypercholesterolemia, unspecified: Secondary | ICD-10-CM

## 2023-03-19 NOTE — Telephone Encounter (Signed)
OV 05/26/22- ok for 90 day supply Requested Prescriptions  Pending Prescriptions Disp Refills   rosuvastatin (CRESTOR) 20 MG tablet [Pharmacy Med Name: ROSUVASTATIN CALCIUM 20 MG TAB] 90 tablet 1    Sig: TAKE 1 TABLET BY MOUTH EVERY DAY     Cardiovascular:  Antilipid - Statins 2 Failed - 03/18/2023 10:30 AM      Failed - Valid encounter within last 12 months    Recent Outpatient Visits           1 year ago Hyperlipidemia, unspecified hyperlipidemia type   [facility] Donita Brooks, MD   2 years ago Lymphadenopathy of head and neck   [facility] Pickard, Priscille Heidelberg, MD   2 years ago Gynecologic exam normal   [facility] [place], Velna Hatchet, MD   2 years ago Malodorous urine   [facility] [place], Velna Hatchet, MD   3 years ago Pure hypercholesterolemia   [facility] Pickard, Priscille Heidelberg, MD              Failed - Lipid Panel in normal range within the last 12 months    Cholesterol  Date Value Ref Range Status  05/26/2022 131 <200 mg/dL Final   LDL Cholesterol (Calc)  Date Value Ref Range Status  05/26/2022 45 mg/dL (calc) Final    Comment:    Reference range: <100 . Desirable range <100 mg/dL for primary prevention;   <70 mg/dL for patients with CHD or diabetic patients  with > or = 2 CHD risk factors. Marland Kitchen LDL-C is now calculated using the Martin-Hopkins  calculation, which is a validated novel method providing  better accuracy than the Friedewald equation in the  estimation of LDL-C.  Horald Pollen et al. Lenox Ahr. 1610;960(45): 2061-2068  (http://education.[facility].com/faq/FAQ164)    HDL  Date Value Ref Range Status  05/26/2022 63 > OR = 50 mg/dL Final   Triglycerides  Date Value Ref Range Status  05/26/2022 149 <150 mg/dL Final         Passed - Cr in normal range and within 360 days    Creat  Date Value Ref Range Status  05/26/2022 0.83 0.50 - 1.05 mg/dL Final         Passed  - Patient is not pregnant

## 2023-05-19 ENCOUNTER — Telehealth: Payer: Self-pay

## 2023-05-19 NOTE — Telephone Encounter (Signed)
Pt called in wanting to know if she could a refill of these meds. Pt stated that she is aware that one of these meds has been discontinued, but her migraines have started back up would like to go back to using this med. Please advise.  albuterol (VENTOLIN HFA) 108 (90 Base) MCG/ACT inhaler [[id number]] AIMOVIG 70 MG/ML SOAJ [[id number]]  DISCONTINUED   LOV: 03/13/23  PHARMACY: [id number] [place], [place] - [address] [place] [address] Phone: [phone]  Fax: [phone] DEA #: [id number]    Cb#: [phone]

## 2023-05-20 ENCOUNTER — Other Ambulatory Visit: Payer: Self-pay

## 2023-05-20 DIAGNOSIS — J208 Acute bronchitis due to other specified organisms: Secondary | ICD-10-CM

## 2023-05-20 MED ORDER — ALBUTEROL SULFATE HFA 108 (90 BASE) MCG/ACT IN AERS
2.0000 | INHALATION_SPRAY | Freq: Four times a day (QID) | RESPIRATORY_TRACT | 3 refills | Status: DC | PRN
Start: 2023-05-20 — End: 2023-07-26

## 2023-05-21 ENCOUNTER — Other Ambulatory Visit: Payer: Self-pay

## 2023-05-21 DIAGNOSIS — Z8669 Personal history of other diseases of the nervous system and sense organs: Secondary | ICD-10-CM

## 2023-05-21 MED ORDER — AIMOVIG 70 MG/ML ~~LOC~~ SOAJ
SUBCUTANEOUS | 3 refills | Status: DC
Start: 2023-05-21 — End: 2023-07-26

## 2023-06-24 ENCOUNTER — Ambulatory Visit: Payer: Self-pay | Admitting: Family Medicine

## 2023-06-24 NOTE — Telephone Encounter (Signed)
  Chief Complaint: sinus pressure Symptoms: nasal congestion/drainage, R ear pain, sore throat, cough, body aches Frequency: constant x3 days Pertinent Negatives: Patient denies SOB, fever Disposition: [] ED /[] Urgent Care (no appt availability in office) / [x] Appointment(In office/virtual)/ []  [facility]/ [] Home Care/ [] Refused Recommended Disposition /[] [facility]/ []  Follow-up with PCP Additional Notes: Pt called stating she began having sinus pressure, R ear pain, cough, sore throat, and body aches 3 days ago. States she has taken OTC medication with some relief, and states she put hydrogen peroxide in her R ear for relief. Denies fever, SOB. Scheduled pt for 11/21 at 0915 in office. Patient verbalized understanding of plan.   Copied from CRM [id number]. Topic: Clinical - Medical Advice >> Jun 24, 2023  2:57 PM Judeth Cornfield R wrote: Swollen lymph nodes-sore throat-ear pain Reason for Disposition  Earache  Answer Assessment - Initial Assessment Questions 1. LOCATION: "Where does it hurt?"      Facial pressure, R ear pain, body aches and chills 2. ONSET: "When did the sinus pain start?"  (e.g., hours, days)      X3 days 3. SEVERITY: "How bad is the pain?"   (Scale 1-10; mild, moderate or severe)   - MILD (1-3): doesn't interfere with normal activities    - MODERATE (4-7): interferes with normal activities (e.g., work or school) or awakens from sleep   - SEVERE (8-10): excruciating pain and patient unable to do any normal activities        8/10 4. RECURRENT SYMPTOM: "Have you ever had sinus problems before?" If Yes, ask: "When was the last time?" and "What happened that time?"      Yes, several years ago 5. NASAL CONGESTION: "Is the nose blocked?" If Yes, ask: "Can you open it or must you breathe through your mouth?"     Yes when lying down 6. NASAL DISCHARGE: "Do you have discharge from your nose?" If so ask, "What color?"     Yes, "like water" yellow in color 7.  FEVER: "Do you have a fever?" If Yes, ask: "What is it, how was it measured, and when did it start?"      No thermometer, feels warm 8. OTHER SYMPTOMS: "Do you have any other symptoms?" (e.g., sore throat, cough, earache, difficulty breathing)     Sore throat, cough, earache  Protocols used: Sinus Pain or Congestion-A-AH

## 2023-06-25 ENCOUNTER — Encounter: Payer: Self-pay | Admitting: Family Medicine

## 2023-06-25 ENCOUNTER — Ambulatory Visit (INDEPENDENT_AMBULATORY_CARE_PROVIDER_SITE_OTHER): Payer: Medicaid Other | Admitting: Family Medicine

## 2023-06-25 VITALS — BP 120/76 | HR 84 | Temp 98.3°F | Ht 62.0 in | Wt 155.0 lb

## 2023-06-25 DIAGNOSIS — R197 Diarrhea, unspecified: Secondary | ICD-10-CM

## 2023-06-25 NOTE — Assessment & Plan Note (Addendum)
Recent history of 2 episodes of diarrhea that has resolved. This occurred after eating and is consistent with her known IBS. Natalie [name] was concerned that she was getting sick prior to the holidays so made this appointment but is feeling better today. Encouraged to return to office if symptoms persist or worsen.

## 2023-06-25 NOTE — Progress Notes (Signed)
Subjective:  HPI: Natalie [name] is a 62 y.o. female presenting on 06/25/2023 for Acute Visit (sore throat//[phone]//[place] medicaid//e2c2 rn)   HPI Patient is in today for 5 days of intermittent diarrhea vs constipation and postnasal drip that is overall improving. She was concerned she may be coming down with a virus prior to thanksgiving but is now feeling better. Ms Arth had an episode of diarrhea Sunday and yesterday after eating, took pepto and then had no further diarrhea. She denies fever, chills, body aches, malaise, cough, sore throat, congestion, rhinorrhea, wheezing/SOB, abdominal pain, nausea, vomiting, melena. hematochezia.   Review of Systems  All other systems reviewed and are negative.   Relevant past medical history reviewed and updated as indicated.   Past Medical History:  Diagnosis Date   Allergy    seasonal   Anxiety    bipolar   Bipolar 1 disorder (HCC)    Cataract    Depression    Glaucoma    stage 2   History of IBS    IBS- D   Hyperlipidemia    Smoker      Past Surgical History:  Procedure Laterality Date   COLONOSCOPY  2012   Dr. Arlyce Dice; 1 tubular adenoma.   COSMETIC SURGERY  08/04/1965   face after accident   EYELID LACERATION REPAIR     POLYPECTOMY     TUBAL LIGATION  08/05/1991    Allergies and medications reviewed and updated.   Current Outpatient Medications:    albuterol (VENTOLIN HFA) 108 (90 Base) MCG/ACT inhaler, Inhale 2 puffs into the lungs every 6 (six) hours as needed for wheezing or shortness of breath., Disp: 8 g, Rfl: 3   Erenumab-aooe (AIMOVIG) 70 MG/ML SOAJ, INJECT 70 MG INTO THE SKIN EVERY 30 (THIRTY) DAYS., Disp: 1 mL, Rfl: 3   lamoTRIgine (LAMICTAL) 100 MG tablet, Take 1 tablet (100 mg total) by mouth every morning., Disp: 30 tablet, Rfl: 0   Multiple Vitamins-Minerals (CENTRUM SILVER 50+WOMEN PO), Take 1 tablet by mouth daily., Disp: , Rfl:    PARoxetine (PAXIL) 20 MG tablet, Take 1 tablet (20 mg total) by mouth  daily., Disp: , Rfl:    Probiotic Product (PROBIOTIC PO), Take by mouth., Disp: , Rfl:    propranolol (INDERAL) 10 MG tablet, Take 10 mg by mouth as needed., Disp: , Rfl:    QUEtiapine (SEROQUEL) 100 MG tablet, Take 100 mg by mouth at bedtime., Disp: , Rfl:    QUEtiapine (SEROQUEL) 25 MG tablet, Take 25 mg by mouth at bedtime., Disp: , Rfl:    rosuvastatin (CRESTOR) 20 MG tablet, TAKE 1 TABLET BY MOUTH EVERY DAY, Disp: 90 tablet, Rfl: 0   tretinoin (RETIN-A) 0.025 % cream, Apply topically at bedtime., Disp: 45 g, Rfl: 5  Allergies  Allergen Reactions   Sulfa Antibiotics Anaphylaxis and Swelling     flu like sx   Aspirin Other (See Comments)    Has ulcers   Codeine Nausea And Vomiting and Other (See Comments)    Stomach pain   Hydrocodone Nausea And Vomiting   Mobic [Meloxicam] Nausea And Vomiting   Topamax [Topiramate] Other (See Comments)    Dizziness, Weird taste in mouth,sick feeling   Tylenol [Acetaminophen] Other (See Comments)    Has ulcers    Objective:   BP 120/76   Pulse 84   Temp 98.3 F (36.8 C) (Oral)   Ht 5\' 2"  (1.575 m)   Wt 155 lb (70.3 kg)   LMP 11/16/2009   SpO2  94%   BMI 28.35 kg/m      06/25/2023    9:04 AM 03/17/2023   12:53 PM 03/13/2023   11:56 AM  Vitals with BMI  Height 5\' 2"     Weight 155 lbs  155 lbs  BMI 28.34    Systolic 120 127 284  Diastolic 76 75 72  Pulse 84 57 85     Physical Exam Vitals and nursing note reviewed.  Constitutional:      Appearance: Normal appearance. She is normal weight.  HENT:     Head: Normocephalic and atraumatic.  Cardiovascular:     Rate and Rhythm: Normal rate and regular rhythm.     Pulses: Normal pulses.     Heart sounds: Normal heart sounds.  Pulmonary:     Effort: Pulmonary effort is normal.     Breath sounds: Normal breath sounds.  Abdominal:     General: Bowel sounds are normal. There is no distension.     Palpations: Abdomen is soft.     Tenderness: There is no abdominal tenderness.   Skin:    General: Skin is warm and dry.  Neurological:     General: No focal deficit present.     Mental Status: She is alert and oriented to person, place, and time. Mental status is at baseline.  Psychiatric:        Mood and Affect: Mood normal.        Behavior: Behavior normal.        Thought Content: Thought content normal.        Judgment: Judgment normal.     Assessment & Plan:  Diarrhea, unspecified type Assessment & Plan: Recent history of 2 episodes of diarrhea that has resolved. This occurred after eating and is consistent with her known IBS. Ms Laneve was concerned that she was getting sick prior to the holidays so made this appointment but is feeling better today. Encouraged to return to office if symptoms persist or worsen.       Follow up plan: Return if symptoms worsen or fail to improve.  Park Meo, FNP

## 2023-07-15 ENCOUNTER — Ambulatory Visit: Payer: Medicaid Other | Admitting: Obstetrics and Gynecology

## 2023-07-26 ENCOUNTER — Emergency Department (HOSPITAL_COMMUNITY)
Admission: EM | Admit: 2023-07-26 | Discharge: 2023-07-28 | Disposition: A | Discharge status: Other Acute Inpatient Hospital | Payer: Medicaid Other | Attending: Emergency Medicine | Admitting: Emergency Medicine

## 2023-07-26 ENCOUNTER — Encounter (HOSPITAL_COMMUNITY): Payer: Self-pay

## 2023-07-26 ENCOUNTER — Other Ambulatory Visit: Payer: Self-pay

## 2023-07-26 DIAGNOSIS — F99 Mental disorder, not otherwise specified: Secondary | ICD-10-CM | POA: Insufficient documentation

## 2023-07-26 DIAGNOSIS — F22 Delusional disorders: Secondary | ICD-10-CM | POA: Insufficient documentation

## 2023-07-26 DIAGNOSIS — F314 Bipolar disorder, current episode depressed, severe, without psychotic features: Secondary | ICD-10-CM | POA: Diagnosis present

## 2023-07-26 DIAGNOSIS — F3162 Bipolar disorder, current episode mixed, moderate: Secondary | ICD-10-CM | POA: Diagnosis present

## 2023-07-26 DIAGNOSIS — R110 Nausea: Secondary | ICD-10-CM | POA: Insufficient documentation

## 2023-07-26 DIAGNOSIS — F32A Depression, unspecified: Secondary | ICD-10-CM | POA: Insufficient documentation

## 2023-07-26 DIAGNOSIS — R451 Restlessness and agitation: Secondary | ICD-10-CM | POA: Insufficient documentation

## 2023-07-26 DIAGNOSIS — E876 Hypokalemia: Secondary | ICD-10-CM | POA: Insufficient documentation

## 2023-07-26 LAB — CBC
HCT: 40.5 % (ref 36.0–46.0)
Hemoglobin: 14.1 g/dL (ref 12.0–15.0)
MCH: 32.2 pg (ref 26.0–34.0)
MCHC: 34.8 g/dL (ref 30.0–36.0)
MCV: 92.5 fL (ref 80.0–100.0)
Platelets: 201 10*3/uL (ref 150–400)
RBC: 4.38 MIL/uL (ref 3.87–5.11)
RDW: 12.6 % (ref 11.5–15.5)
WBC: 7.6 10*3/uL (ref 4.0–10.5)
nRBC: 0 % (ref 0.0–0.2)

## 2023-07-26 LAB — COMPREHENSIVE METABOLIC PANEL
ALT: 33 U/L (ref 0–44)
AST: 32 U/L (ref 15–41)
Albumin: 4.3 g/dL (ref 3.5–5.0)
Alkaline Phosphatase: 74 U/L (ref 38–126)
Anion gap: 13 (ref 5–15)
BUN: 7 mg/dL — ABNORMAL LOW (ref 8–23)
CO2: 24 mmol/L (ref 22–32)
Calcium: 8.9 mg/dL (ref 8.9–10.3)
Chloride: 99 mmol/L (ref 98–111)
Creatinine, Ser: 1.07 mg/dL — ABNORMAL HIGH (ref 0.44–1.00)
GFR, Estimated: 58 mL/min — ABNORMAL LOW (ref >60)
Glucose, Bld: 139 mg/dL — ABNORMAL HIGH (ref 70–99)
Potassium: 2.8 mmol/L — ABNORMAL LOW (ref 3.5–5.1)
Sodium: 136 mmol/L (ref 135–145)
Total Bilirubin: 0.7 mg/dL (ref <1.2)
Total Protein: 7.4 g/dL (ref 6.5–8.1)

## 2023-07-26 LAB — RAPID URINE DRUG SCREEN, HOSP PERFORMED
Amphetamines: NOT DETECTED
Barbiturates: NOT DETECTED
Benzodiazepines: NOT DETECTED
Cocaine: NOT DETECTED
Opiates: NOT DETECTED
Tetrahydrocannabinol: POSITIVE — AB

## 2023-07-26 LAB — SALICYLATE LEVEL: Salicylate Lvl: <7 mg/dL — ABNORMAL LOW (ref 7.0–30.0)

## 2023-07-26 LAB — ACETAMINOPHEN LEVEL: Acetaminophen (Tylenol), Serum: <10 ug/mL — ABNORMAL LOW (ref 10–30)

## 2023-07-26 LAB — ETHANOL: Alcohol, Ethyl (B): <10 mg/dL (ref <10)

## 2023-07-26 MED ORDER — POTASSIUM CHLORIDE CRYS ER 20 MEQ PO TBCR
40.0000 meq | EXTENDED_RELEASE_TABLET | Freq: Once | ORAL | Status: AC
  Administered 2023-07-26: 40 meq via ORAL
  Filled 2023-07-26: qty 2

## 2023-07-26 MED ORDER — POTASSIUM CHLORIDE CRYS ER 20 MEQ PO TBCR
40.0000 meq | EXTENDED_RELEASE_TABLET | Freq: Every day | ORAL | Status: DC
  Administered 2023-07-27 – 2023-07-28 (×2): 40 meq via ORAL
  Filled 2023-07-26 (×2): qty 2

## 2023-07-26 MED ORDER — ZOLPIDEM TARTRATE 5 MG PO TABS: 5.0000 mg | ORAL_TABLET | Freq: Every evening | ORAL | Status: DC | PRN

## 2023-07-26 MED ORDER — ZIPRASIDONE MESYLATE 20 MG IM SOLR: 20.0000 mg | INTRAMUSCULAR | Status: DC | PRN

## 2023-07-26 MED ORDER — POTASSIUM CHLORIDE CRYS ER 20 MEQ PO TBCR: 40.0000 meq | EXTENDED_RELEASE_TABLET | Freq: Once | ORAL | Status: DC

## 2023-07-26 MED ORDER — LORAZEPAM 1 MG PO TABS: 1.0000 mg | ORAL_TABLET | ORAL | Status: DC | PRN

## 2023-07-26 MED ORDER — RISPERIDONE 0.5 MG PO TBDP: 2.0000 mg | ORAL_TABLET | Freq: Three times a day (TID) | ORAL | Status: DC | PRN

## 2023-07-26 NOTE — ED Triage Notes (Signed)
Pt has bipolar disorder, was off her medicaiton, started taking medication again 2 weeks ago. Pt family reports pt has been having sx of being off her medication since a few weeks before Thanksgiving. Pt has been having mood swings. Denies SI/HI or hallucinations.

## 2023-07-26 NOTE — ED Provider Notes (Addendum)
[facility] AT [facility] Provider Note   CSN: [id number] Arrival date & time: 07/26/23  1143     History  Chief Complaint  Patient presents with   Psychiatric Evaluation    Natalie [name] is a 62 y.o. female.  HPI    62 year old female comes to the emergency room with chief complaint of nausea worsening from mental illness.  Patient brought by sister, and brother who are at the bedside.  According to the siblings, patient lives by herself.  Around Thanksgiving they started receiving more frequent calls from the patient, every 1 hour, and she was very labile with her emotions.  They went to check on her and noted that her medications were all mixed up.  Patient was not taking all her medications and stated that she was overmedicated.  They sorted her medications and took her to her daughter's house for Thanksgiving.  Within a day, she started texting her family to come pick her up.  That is unusual for the patient, as normally she would have left to spend more time with her daughter.   Upon return to this area, she started living with her brother.  They had a follow-up with the psychiatrist who works at [employer].  The psychiatrist indicated that patient likely has been chronically mismanaging her medications based on their initial assessment.  They restarted her medications and the follow-up appointment was supposed to be last week, but there were some technical difficulties and the appointment now is set in January.  The family was during that follow-up appointment going to mention that patient is still not getting better.  They have been to making sure patient is taking her medications.    2 days ago, patient insisted that she go home again, which family followed through on.  However, this morning she called family again and stated that she thinks he needs to go to the hospital.  Patient had similar episode in 2020, and at that time patient had stopped  taking her medications.  Patient however was able to get back to being herself with outpatient management and follow-up.  At this time patient is not responding to home remedy.  Patient denies any SI, HI.  She states that she is overmedicated.  She states that her medications are  are still in her system.  Home Medications Prior to Admission medications   Medication Sig Start Date End Date Taking? Authorizing Provider  acetaminophen (TYLENOL) 500 MG tablet Take 500 mg by mouth every 6 (six) hours as needed for mild pain (pain score 1-3) or moderate pain (pain score 4-6).   Yes [provider]  lamoTRIgine (LAMICTAL) 100 MG tablet Take 1 tablet (100 mg total) by mouth every morning. Patient taking differently: Take 100 mg by mouth 2 (two) times daily. 03/16/14  Yes Withrow, Everardo All, FNP  Multiple Vitamins-Minerals (CENTRUM SILVER 50+WOMEN PO) Take 1 tablet by mouth daily.   Yes [provider]  PARoxetine (PAXIL) 20 MG tablet Take 1 tablet (20 mg total) by mouth daily. 03/16/14  Yes Withrow, Everardo All, FNP  QUEtiapine (SEROQUEL) 50 MG tablet Take 50 mg by mouth at bedtime. 05/21/21  Yes [provider]  rosuvastatin (CRESTOR) 20 MG tablet TAKE 1 TABLET BY MOUTH EVERY DAY 03/19/23  Yes Donita Brooks, MD      Allergies    Sulfa antibiotics, Aspirin, Codeine, Hydrocodone, Mobic [meloxicam], Topamax [topiramate], and Tylenol [acetaminophen]    Review of Systems   Review of  Systems  All other systems reviewed and are negative.   Physical Exam Updated Vital Signs BP 130/67   Pulse 90   Temp 98.1 F (36.7 C) (Oral)   Resp 18   Ht 5\' 2"  (1.575 m)   Wt 70.3 kg   LMP 11/16/2009   SpO2 97%   BMI 28.35 kg/m  Physical Exam Vitals and nursing note reviewed.  Constitutional:      Appearance: She is well-developed.  HENT:     Head: Normocephalic and atraumatic.  Eyes:     Extraocular Movements: Extraocular movements intact.  Cardiovascular:     Rate and Rhythm:  Normal rate.  Pulmonary:     Effort: Pulmonary effort is normal.  Musculoskeletal:     Cervical back: Normal range of motion and neck supple.  Skin:    General: Skin is dry.  Neurological:     Mental Status: She is alert and oriented to person, place, and time.  Psychiatric:        Attention and Perception: She is inattentive.        Mood and Affect: Mood is depressed. Affect is labile and flat.        Speech: Speech normal.        Behavior: Behavior is agitated. Behavior is cooperative.        Thought Content: Thought content is paranoid.     ED Results / Procedures / Treatments   Labs (all labs ordered are listed, but only abnormal results are displayed) Labs Reviewed  COMPREHENSIVE METABOLIC PANEL - Abnormal; Notable for the following components:      Result Value   Potassium 2.8 (*)    Glucose, Bld 139 (*)    BUN 7 (*)    Creatinine, Ser 1.07 (*)    GFR, Estimated 58 (*)    All other components within normal limits  SALICYLATE LEVEL - Abnormal; Notable for the following components:   Salicylate Lvl <7.0 (*)    All other components within normal limits  ACETAMINOPHEN LEVEL - Abnormal; Notable for the following components:   Acetaminophen (Tylenol), Serum <10 (*)    All other components within normal limits  RAPID URINE DRUG SCREEN, HOSP PERFORMED - Abnormal; Notable for the following components:   Tetrahydrocannabinol POSITIVE (*)    All other components within normal limits  ETHANOL  CBC    EKG EKG Interpretation Date/Time:  Sunday July 26 2023 14:10:33 EST Ventricular Rate:  77 PR Interval:  129 QRS Duration:  98 QT Interval:  394 QTC Calculation: 446 R Axis:   41  Text Interpretation: Sinus rhythm normal axis normal intervals No significant change since last tracing Confirmed by Derwood Kaplan [id number]) on 07/26/2023 2:13:59 PM  Radiology No results found.  Procedures Procedures    Medications Ordered in ED Medications  risperiDONE (RISPERDAL  M-TABS) disintegrating tablet 2 mg (has no administration in time range)    And  LORazepam (ATIVAN) tablet 1 mg (has no administration in time range)    And  ziprasidone (GEODON) injection 20 mg (has no administration in time range)  zolpidem (AMBIEN) tablet 5 mg (has no administration in time range)  potassium chloride SA (KLOR-CON M) CR tablet 40 mEq (40 mEq Oral Given 07/26/23 1549)    Followed by  potassium chloride SA (KLOR-CON M) CR tablet 40 mEq (has no administration in time range)    ED Course/ Medical Decision Making/ A&P Clinical Course as of 07/26/23 1610  Sun Jul 26, 2023  1609 Potassium(!): 2.8  K is 2.8. We will give her 2 rounds of oral K. Probably will need repeat metabolic profile checked again tomorrow. [AN]    Clinical Course User Index [AN] Derwood Kaplan, MD                                 Medical Decision Making Amount and/or Complexity of Data Reviewed Labs: ordered.  Risk Prescription drug management.   Patient comes to the ER with cc of worsening mental health. Patient has pertinent past medical history of bipolar disorder and depression, for which she is on lamotrigine, Seroquel and Paxil. Currently patient is demonstrating labile mood, depressed and flat affect.  Pt denies nausea, emesis, fevers, chills, chest pains, shortness of breath, headaches, abdominal pain, uti like symptoms and patient has no active medical complaints. I have reviewed previous encounters for this patient and reviewed their primary medications.  History also provided by patient's siblings.  Differential diagnosis considered for this patient includes: Depression Bipolar disorder Schizophrenia Substance abuse Suicidal ideation Acute withdrawal  Appropriate labs have been ordered. Patient is medically cleared for psychiatric evaluation.   Family is concerned that patient is not improving.  They are comfortable with involuntary commitment in order for patient to get  appropriate treatment if needed.   Final Clinical Impression(s) / ED Diagnoses Final diagnoses:  Bipolar disorder, current episode mixed, moderate (HCC)    Rx / DC Orders ED Discharge Orders     None         Derwood Kaplan, MD 07/26/23 1414    Derwood Kaplan, MD 07/26/23 1610

## 2023-07-27 DIAGNOSIS — F314 Bipolar disorder, current episode depressed, severe, without psychotic features: Secondary | ICD-10-CM

## 2023-07-27 LAB — BASIC METABOLIC PANEL
Anion gap: 9 (ref 5–15)
BUN: 9 mg/dL (ref 8–23)
CO2: 25 mmol/L (ref 22–32)
Calcium: 9.3 mg/dL (ref 8.9–10.3)
Chloride: 105 mmol/L (ref 98–111)
Creatinine, Ser: 0.8 mg/dL (ref 0.44–1.00)
GFR, Estimated: >60 mL/min (ref >60)
Glucose, Bld: 118 mg/dL — ABNORMAL HIGH (ref 70–99)
Potassium: 3.9 mmol/L (ref 3.5–5.1)
Sodium: 139 mmol/L (ref 135–145)

## 2023-07-27 MED ORDER — BISMUTH SUBSALICYLATE 262 MG/15ML PO SUSP
30.0000 mL | ORAL | Status: DC | PRN
  Administered 2023-07-27: 30 mL via ORAL
  Filled 2023-07-27: qty 236

## 2023-07-27 MED ORDER — ROSUVASTATIN CALCIUM 20 MG PO TABS
20.0000 mg | ORAL_TABLET | Freq: Every day | ORAL | Status: DC
  Administered 2023-07-27 – 2023-07-28 (×2): 20 mg via ORAL
  Filled 2023-07-27 (×2): qty 1

## 2023-07-27 MED ORDER — LAMOTRIGINE 100 MG PO TABS
100.0000 mg | ORAL_TABLET | Freq: Two times a day (BID) | ORAL | Status: DC
  Administered 2023-07-27 – 2023-07-28 (×4): 100 mg via ORAL
  Filled 2023-07-27 (×4): qty 1

## 2023-07-27 MED ORDER — QUETIAPINE FUMARATE 50 MG PO TABS: 50.0000 mg | ORAL_TABLET | Freq: Every day | ORAL | Status: DC

## 2023-07-27 MED ORDER — QUETIAPINE FUMARATE 50 MG PO TABS
50.0000 mg | ORAL_TABLET | Freq: Every day | ORAL | Status: DC
  Administered 2023-07-27: 50 mg via ORAL
  Filled 2023-07-27: qty 1

## 2023-07-27 MED ORDER — POTASSIUM CHLORIDE CRYS ER 20 MEQ PO TBCR
40.0000 meq | EXTENDED_RELEASE_TABLET | ORAL | Status: DC
  Administered 2023-07-27: 40 meq via ORAL
  Filled 2023-07-27: qty 2

## 2023-07-27 MED ORDER — PAROXETINE HCL 20 MG PO TABS: 20.0000 mg | ORAL_TABLET | Freq: Every day | ORAL | Status: DC

## 2023-07-27 MED ORDER — MELATONIN 5 MG PO TABS: 5.0000 mg | ORAL_TABLET | Freq: Every day | ORAL | Status: DC

## 2023-07-27 NOTE — ED Notes (Signed)
[facility] called for a report on pt, will send acceptance letter to social work. No time indicated for transfer but estimated that pt will have a bed sometime tomorrow.

## 2023-07-27 NOTE — Progress Notes (Signed)
[facility] Consult Note  Patient Name: Natalie [name] MRN: [id number] DOB: 1961-05-17 DATE OF Consult: 07/27/2023  PRIMARY PSYCHIATRIC DIAGNOSES  1.  Bipolar Depression   RECOMMENDATIONS  Recommendations: Medication recommendations: At this point, given recent challenges with figuring what and how much medications she has been taking, would recommend that hold off starting scheduled meds in the ED, so that inpatient providers can work with determining what regimen will be best now.  Patient has already been prescribed PRN medications (Risperdal M-Tab, Ativan, and IM Geodon, and I recommend that these medications continue to be available for PRN usage. Non-Medication/therapeutic recommendations: Given patient's challenges in being able to respond adequately to her environment, I do recommend that she remain under careful observation until she can be transferred to an accepting facility Is inpatient psychiatric hospitalization recommended for this patient? Yes (Explain why): Patient has had a dramatic worsening of her symptoms (confusion, depression, inability to manage emotions) over the past month, and her symptoms are not improving, even with the medication changes that her outpatient provider made.  At this point, she is not completing ADL's adequately and is quite withdrawn.  Given that she has had problems with both over- and undermedication in recent years, she will benefit from the more careful observation of her medication responses that can be accomplished in inpatient service.  Patient is willing to sign herself in voluntarily. Follow-Up Telepsychiatry C/L services: We will sign off for now. Please re-consult our service if needed for any concerning changes in the patient's condition, discharge planning, or questions. Communication: Treatment team members (and family members if applicable) who were involved in treatment/care discussions and planning, and with whom we spoke or engaged with  via secure text/chat, include the following: Sent secure message to Dr. Preston Fleeting, ED attending, and staff, outlining above recommendations.  Thank you for involving [place] in the care of this patient. If you have any additional questions or concerns, please call [phone] and ask for the provider on-call.   TELEPSYCHIATRY ATTESTATION & CONSENT   As the provider for this telehealth consult, I atteest that I verified the patient's identity using two separate identifiers, introduced myself to the patient, provided my credentials, disclosed my location, and performed this encounter via a HIPAA-compliant, real-time, face-to-face, two-way, interactive audio and video platform and with the full consent and agreement of the patient (or guardian as applicable.)   Patient physical location: [facility]. Telehealth provider physical location: home office in state of [place].  Video start time: 0045h EST  Video end time: 0115h EST   Total time spent in this encounter was 50 minutes, including record review, clinical interview, behavior observations, discussion of impressions and recommendations (including medications and hospitalization), and consultation/communication with relevant parties   IDENTIFYING DATA  Natalie [name] is a 62 y.o. year-old female for whom a psychiatric consultation has been ordered by the primary provider. The patient was identified using two separate identifiers.  CHIEF COMPLAINT/REASON FOR CONSULT   I'm not doing OK.   HISTORY OF PRESENT ILLNESS (HPI)  The patient presents with a long history of bipolar disorder sx's, with history of both mania and depression.  Patient reports that since the relatively recent death of her father, she has been struggling to figure out her medications, and it appears that at some times she has undermedicated herself and sometimes overmedicated herself.  She had a similar problem about five years ago, and at that time, with the help of her  outpatient provider, she was able to  return to normal mood balance.  However,this time she has persisted in having marked depression, with marked paucity of thought, emotional deadening, and inability to think clear to manage her ADL's.  In addition, she has struggled with taking her medications as prescribed, as she believes that she is being "overmedicated," even though they have been working with her outpatient provider, who has been clarifying her medication regimen. As a result, patient has had a dramatic decrease in functioning since Thanksgiving.  At that time, she went to live with her brother, but she continued to struggle with completing tasks.  Then just recently she decided that she was well enough to return to her home, and so family took her home, but soon afterwards she contacted family and stated that she need to go to the hospital for stabilization.  Patient has not had suicidal or homicidal ideation, nor any hallucinations.  No history of suicide attempts.  No report of drug/EtOH usage.   PAST PSYCHIATRIC HISTORY   Otherwise as per HPI above.  PAST MEDICAL HISTORY  Past Medical History:  Diagnosis Date   Allergy    seasonal   Anxiety    bipolar   Bipolar 1 disorder (HCC)    Cataract    Depression    Glaucoma    stage 2   History of IBS    IBS- D   Hyperlipidemia    Smoker      HOME MEDICATIONS  Facility Ordered Medications  Medication   risperiDONE (RISPERDAL M-TABS) disintegrating tablet 2 mg   And   LORazepam (ATIVAN) tablet 1 mg   And   ziprasidone (GEODON) injection 20 mg   zolpidem (AMBIEN) tablet 5 mg   [COMPLETED] potassium chloride SA (KLOR-CON M) CR tablet 40 mEq   Followed by   potassium chloride SA (KLOR-CON M) CR tablet 40 mEq   potassium chloride SA (KLOR-CON M) CR tablet 40 mEq   PTA Medications  Medication Sig   lamoTRIgine (LAMICTAL) 100 MG tablet Take 1 tablet (100 mg total) by mouth every morning. (Patient taking differently: Take 100 mg  by mouth 2 (two) times daily.)   PARoxetine (PAXIL) 20 MG tablet Take 1 tablet (20 mg total) by mouth daily.   Multiple Vitamins-Minerals (CENTRUM SILVER 50+WOMEN PO) Take 1 tablet by mouth daily.   QUEtiapine (SEROQUEL) 50 MG tablet Take 50 mg by mouth at bedtime.   rosuvastatin (CRESTOR) 20 MG tablet TAKE 1 TABLET BY MOUTH EVERY DAY    Patient is now supposed to be on the above psychotropics, but it may be that she has been adjusting her dosages.   ALLERGIES  Allergies  Allergen Reactions   Sulfa Antibiotics Anaphylaxis and Swelling     flu like sx   Aspirin Other (See Comments)    Has ulcers   Codeine Nausea And Vomiting and Other (See Comments)    Stomach pain   Hydrocodone Nausea And Vomiting   Mobic [Meloxicam] Nausea And Vomiting   Topamax [Topiramate] Other (See Comments)    Dizziness, Weird taste in mouth,sick feeling   Tylenol [Acetaminophen] Other (See Comments)    Has ulcers    SOCIAL & SUBSTANCE USE HISTORY  Social History   Socioeconomic History   Marital status: Married    Spouse name: Not on file   Number of children: Not on file   Years of education: Not on file   Highest education level: Not on file  Occupational History   Not on file  Tobacco Use  Smoking status: Former    Types: Cigarettes   Smokeless tobacco: Never   Tobacco comments:    quit 03/2014 and quit 12-02-2021  Substance and Sexual Activity   Alcohol use: No    Alcohol/week: 0.0 standard drinks of alcohol   Drug use: No   Sexual activity: Not Currently  Other Topics Concern   Not on file  Social History Narrative   Not on file   Social Drivers of Health   Financial Resource Strain: Not on file  Food Insecurity: Not on file  Transportation Needs: Not on file  Physical Activity: Not on file  Stress: Not on file  Social Connections: Not on file   Social History   Tobacco Use  Smoking Status Former   Types: Cigarettes  Smokeless Tobacco Never  Tobacco Comments   quit 03/2014  and quit 12-02-2021   Social History   Substance and Sexual Activity  Alcohol Use No   Alcohol/week: 0.0 standard drinks of alcohol   Social History   Substance and Sexual Activity  Drug Use No    .  FAMILY HISTORY  Family History  Problem Relation Age of Onset   Colon polyps Father    Hypertension Father    Hyperlipidemia Father    Colon polyps Sister    Colon cancer Maternal Grandfather        dx'd in his 81's   Cancer Neg Hx    Heart disease Neg Hx    Stomach cancer Neg Hx    Rectal cancer Neg Hx    Family Psychiatric History (if known):  Mood disorder, paternal side   MENTAL STATUS EXAM (MSE)  Mental Status Exam: General Appearance: Casual and Guarded  Orientation:  Full (Time, Place, and Person)  Memory:  Immediate;   Fair Recent;   Fair Remote;   Fair  Concentration:  Concentration: Poor and Attention Span: Poor  Recall:  Poor  Attention  Poor  Eye Contact:   Minimal, with frequent glances at her brother, although stated clearly that she wished him in room and trusts him  Speech:  Blocked and Slow  Language:  Good  Volume:  Decreased  Mood: I'm not sure  Affect:  Blunt, Constricted, and Flat  Thought Process:  Disorganized  Thought Content:  Rumination  Suicidal Thoughts:  No  Homicidal Thoughts:  No  Judgement:  Impaired  Insight:  Lacking  Psychomotor Activity:  Psychomotor Retardation  Akathisia:  Negative  Fund of Knowledge:  Fair    Assets:  Desire for Improvement Social Support  Cognition:  WNL  ADL's:  Impaired, given her level of confusion and depression  AIMS (if indicated):       VITALS  Blood pressure (!) 125/53, pulse 71, temperature 98.4 F (36.9 C), temperature source Oral, resp. rate 20, height 5\' 2"  (1.575 m), weight 70.3 kg, last menstrual period 11/16/2009, SpO2 99%.  LABS  Admission on 07/26/2023  Component Date Value Ref Range Status   Sodium 07/26/2023 136  135 - 145 mmol/L Final   Potassium 07/26/2023 2.8 (L)  3.5 - 5.1  mmol/L Final   Chloride 07/26/2023 99  98 - 111 mmol/L Final   CO2 07/26/2023 24  22 - 32 mmol/L Final   Glucose, Bld 07/26/2023 139 (H)  70 - 99 mg/dL Final   Glucose reference range applies only to samples taken after fasting for at least 8 hours.   BUN 07/26/2023 7 (L)  8 - 23 mg/dL Final   Creatinine, Ser 07/26/2023  1.07 (H)  0.44 - 1.00 mg/dL Final   Calcium 96/11/5407 8.9  8.9 - 10.3 mg/dL Final   Total Protein 81/19/1478 7.4  6.5 - 8.1 g/dL Final   Albumin 29/56/2130 4.3  3.5 - 5.0 g/dL Final   AST 86/57/8469 32  15 - 41 U/L Final   ALT 07/26/2023 33  0 - 44 U/L Final   Alkaline Phosphatase 07/26/2023 74  38 - 126 U/L Final   Total Bilirubin 07/26/2023 0.7  <1.2 mg/dL Final   GFR, Estimated 07/26/2023 58 (L)  >60 mL/min Final   Comment: (NOTE) Calculated using the CKD-EPI Creatinine Equation (2021)    Anion gap 07/26/2023 13  5 - 15 Final   Performed at [facility], [address] [place], [place] [address]   Alcohol, Ethyl (B) 07/26/2023 <10  <10 mg/dL Final   Comment: (NOTE) Lowest detectable limit for serum alcohol is 10 mg/dL.  For medical purposes only. Performed at [facility], [address], [place], [place] [address]    Salicylate Lvl 07/26/2023 <7.0 (L)  7.0 - 30.0 mg/dL Final   Performed at [facility], [address], [place], [place] [address]   Acetaminophen (Tylenol), Serum 07/26/2023 <10 (L)  10 - 30 ug/mL Final   Comment: (NOTE) Therapeutic concentrations vary significantly. A range of 10-30 ug/mL  may be an effective concentration for many patients. However, some  are best treated at concentrations outside of this range. Acetaminophen concentrations >150 ug/mL at 4 hours after ingestion  and >50 ug/mL at 12 hours after ingestion are often associated with  toxic reactions.  Performed at [facility], [address] [place], [place] [address]    WBC 07/26/2023 7.6  4.0 -  10.5 K/uL Final   RBC 07/26/2023 4.38  3.87 - 5.11 MIL/uL Final   Hemoglobin 07/26/2023 14.1  12.0 - 15.0 g/dL Final   HCT 66/44/0347 40.5  36.0 - 46.0 % Final   MCV 07/26/2023 92.5  80.0 - 100.0 fL Final   MCH 07/26/2023 32.2  26.0 - 34.0 pg Final   MCHC 07/26/2023 34.8  30.0 - 36.0 g/dL Final   RDW 42/59/5638 12.6  11.5 - 15.5 % Final   Platelets 07/26/2023 201  150 - 400 K/uL Final   nRBC 07/26/2023 0.0  0.0 - 0.2 % Final   Performed at [facility], [address], [place], [place] [address]   Opiates 07/26/2023 NONE DETECTED  NONE DETECTED Final   Cocaine 07/26/2023 NONE DETECTED  NONE DETECTED Final   Benzodiazepines 07/26/2023 NONE DETECTED  NONE DETECTED Final   Amphetamines 07/26/2023 NONE DETECTED  NONE DETECTED Final   Tetrahydrocannabinol 07/26/2023 POSITIVE (A)  NONE DETECTED Final   Barbiturates 07/26/2023 NONE DETECTED  NONE DETECTED Final   Comment: (NOTE) DRUG SCREEN FOR MEDICAL PURPOSES ONLY.  IF CONFIRMATION IS NEEDED FOR ANY PURPOSE, NOTIFY LAB WITHIN 5 DAYS.  LOWEST DETECTABLE LIMITS FOR URINE DRUG SCREEN Drug Class                     Cutoff (ng/mL) Amphetamine and metabolites    1000 Barbiturate and metabolites    200 Benzodiazepine                 200 Opiates and metabolites        300 Cocaine and metabolites        300 THC  50 Performed at [facility], [address] [place], [place] [address]     PSYCHIATRIC REVIEW OF SYSTEMS (ROS)  ROS: Notable for the following relevant positive findings: Review of Systems  Constitutional: Negative.   HENT: Negative.    Eyes: Negative.   Respiratory: Negative.    Cardiovascular: Negative.   Gastrointestinal: Negative.   Genitourinary: Negative.   Musculoskeletal: Negative.   Skin: Negative.   Neurological: Negative.   Endo/Heme/Allergies: Negative.   Psychiatric/Behavioral:  Positive for depression. The patient is nervous/anxious.      Additional findings:      Musculoskeletal: No abnormal movements observed      Gait & Station: Normal      Pain Screening: Denies      Nutrition & Dental Concerns: Reviewed  RISK FORMULATION/ASSESSMENT  Is the patient experiencing any suicidal or homicidal ideations: No       Explain if yes: Patient is, however, having problems completing ADL's, because of confused thoughts and depression.  Protective factors considered for safety management:   Family has been supportive and providing care, but patient has continued to deteriorate over past weeks  Risk factors/concerns considered for safety management:  Depression Recent loss Age over 14 Hopelessness Isolation Barriers to accessing treatment Unmarried  Is there a safety management plan with the patient and treatment team to minimize risk factors and promote protective factors: No           Explain: Family has been providing constant care and observation, and patient has continued to worsen, with increasing problems with ADL's  Is crisis care placement or psychiatric hospitalization recommended: Yes     Based on my current evaluation and risk assessment, patient is determined at this time to be at:  High risk  *RISK ASSESSMENT Risk assessment is a dynamic process; it is possible that this patient's condition, and risk level, may change. This should be re-evaluated and managed over time as appropriate. Please re-consult psychiatric consult services if additional assistance is needed in terms of risk assessment and management. If your team decides to discharge this patient, please advise the patient how to best access emergency psychiatric services, or to call 911, if their condition worsens or they feel unsafe in any way.   Ezekiel Slocumb, MD Telepsychiatry Consult ServicesPatient ID: Bascom Levels, female   DOB: 1961-02-08, 62 y.o.   MRN: [id number]

## 2023-07-27 NOTE — ED Notes (Signed)
[facility] spoke with pts brother Latrisha Tonkinson) in person for collateral. Pts brother has been in the ED and visiting with pt for support. Pts brother reports that pt stopped taking her medication recently and she has been decompensating since then. Pt stopped because she feels she is over medicated and when given meds by her family members tries to throw it away, say she has already taken it or makes excuses why she does not need to take it. The family has tried bubble packs and a medication box to assist with any confusion over times and dosages but pt still does not take it.   [facility] also spoke with pts sister Hannah Beat) for additional collateral. Pts sister reports that pt stopped her medication and has been finding ways to avoid taking meds when family members insist. Pts sister reports that when pt stops meds she also stops eating, does not talk much and calls her mother on the phone every hour of the day. Per pts sister pt also looks and acts confused. In the past family members have been able to get pt back on medication and stabilize her but it has not worked this time.   Pts aunt who she was close to died a few moths ago which her sister feels may be a trigger for pts recent episode. Pt had been taking meds and was stable for a four year period. Pts family is unsure what of actually triggers pts episodes. Pt does not have close friends though does spend time with another aunt that lives across the street. Pts sister reports a family history of dementia and alzheimers.  Jacquelynn Cree, [facility]  07/27/23

## 2023-07-27 NOTE — Progress Notes (Signed)
[facility] LCSW Progress Note   07/27/2023    9:49 PM  Natalie [name]   [id number]   Type of Contact and Topic:  Psychiatric Bed Placement   Pt accepted to [facility]     Patient meets inpatient criteria per Dr. Elesa Hacker   The attending provider will be Dr. Jonathon Bellows  Call report to [phone]  Rebecca Eaton, RN @ [facility] ED notified.     Pt scheduled  to arrive at [facility] TOMORROW @ 10:00 AM.    Damita Dunnings, MSW, LCSW-A  9:51 PM 07/27/2023

## 2023-07-27 NOTE — ED Provider Notes (Signed)
Emergency Medicine Observation Re-evaluation Note  Natalie [name] is a 62 y.o. female, seen on rounds today.  Pt initially presented to the ED for complaints of Psychiatric Evaluation Currently, the patient is awake. She states she had difficulty sleeping last night.   Physical Exam  BP (!) 125/53 (BP Location: Left Arm)   Pulse 71   Temp 98.4 F (36.9 C) (Oral)   Resp 20   Ht 5\' 2"  (1.575 m)   Wt 70.3 kg   LMP 11/16/2009   SpO2 99%   BMI 28.35 kg/m  Physical Exam General: NAD Cardiac: well perfused Lungs: even and unlabored Psych: no agitation  ED Course / MDM  EKG:EKG Interpretation Date/Time:  Sunday July 26 2023 14:10:33 EST Ventricular Rate:  77 PR Interval:  129 QRS Duration:  98 QT Interval:  394 QTC Calculation: 446 R Axis:   41  Text Interpretation: Sinus rhythm normal axis normal intervals No significant change since last tracing Confirmed by Derwood Kaplan [phone]) on 07/26/2023 2:13:59 PM  I have reviewed the labs performed to date as well as medications administered while in observation.  Recent changes in the last 24 hours include pt taking Seroquel nightly. Pt has Ambien PRN ordered for sleep. Seen overnight also by [facility] telepsych, recommended inpatient. Pt is voluntary. In addition:  "At this point, given recent challenges with figuring what and how much medications she has been taking, would recommend that hold off starting scheduled meds in the ED, so that inpatient providers can work with determining what regimen will be best now.  Patient has already been prescribed PRN medications (Risperdal M-Tab, Ativan, and IM Geodon, and I recommend that these medications continue to be available for PRN usage."  Plan  Current plan is for inpatient psychiatric admission.    Ernie Avena, MD 07/27/23 0730

## 2023-07-27 NOTE — ED Provider Notes (Signed)
Psychiatry consultation is appreciated.  Patient would benefit from inpatient care but does not meet criteria for involuntary commitment.  She is willing to be admitted voluntarily.  I will ask TTS to work on appropriate placement.   Dione Booze, MD 07/27/23 [phone]

## 2023-07-27 NOTE — ED Notes (Signed)
Pt sitting up on side of bed and eating her dinner. No needs at this time.

## 2023-07-27 NOTE — Progress Notes (Signed)
Patient has been denied by [facility] due to no appropriate beds available. Patient meets [facility] inpatient criteria per Dr. Elesa Hacker. Patient has been faxed out to the following facilities:   [facility] [address], [place] [address] [phone]  [facility] Patient Placement [facility] [place] [phone]  [facility] [address] [place] [address] [phone]  [facility] [address] [place], [place] [address] [phone]  [facility] [address] [place] [address] [phone]  [facility] [address] [place] [address] [phone]  [facility] EFAX [address] [place], [place] [phone]  [facility] [address], [place] [address] [phone]  [facility] [address] [place] [address] [phone]  [facility] [address] [place] [address] [phone]  [facility] [address] [place] [address] [phone]  [facility] [address] [place] [address] [phone]  [facility] [address] [place], [place] [address] [phone]  [facility] [address], [place] [address] [phone]  [facility] [address] [place] [address] [phone]  [facility] [address] [place] [address] [phone]  [facility] [address], [place] [address] [phone]  [facility] [address]  [place] [address] [phone]  [facility] [address] [facility] [address] [place] [address] [phone]   Damita Dunnings, MSW, LCSW-A  9:29 PM 07/27/2023

## 2023-07-28 NOTE — ED Provider Notes (Signed)
Emergency Medicine Observation Re-evaluation Note  Natalie [name] is a 63 y.o. female, seen on rounds today.  Pt initially presented to the ED for complaints of Psychiatric Evaluation Currently, the patient is resting comfortably.  Physical Exam  BP 120/70 (BP Location: Left Arm)   Pulse (!) 54   Temp 98.1 F (36.7 C) (Oral)   Resp 18   Ht 1.575 m (5\' 2" )   Wt 70.3 kg   LMP 11/16/2009   SpO2 95%   BMI 28.35 kg/m  Physical Exam   ED Course / MDM  EKG:EKG Interpretation Date/Time:  Sunday July 26 2023 14:10:33 EST Ventricular Rate:  77 PR Interval:  129 QRS Duration:  98 QT Interval:  394 QTC Calculation: 446 R Axis:   41  Text Interpretation: Sinus rhythm normal axis normal intervals No significant change since last tracing Confirmed by Derwood Kaplan [phone]) on 07/26/2023 2:13:59 PM  I have reviewed the labs performed to date as well as medications administered while in observation.  Recent changes in the last 24 hours include none.  Plan  Current plan is for transfer to inpatient psych facility.    Lorre Nick, MD 07/28/23 [phone]

## 2023-07-28 NOTE — ED Notes (Signed)
Family brought belongings for pt. Items have been place behind [facility] Nurses station.

## 2023-07-28 NOTE — ED Notes (Signed)
This RN received a call from [facility] stating that they are about to review Pt's chart.

## 2023-09-16 ENCOUNTER — Encounter: Payer: Self-pay | Admitting: Family Medicine

## 2023-09-16 ENCOUNTER — Ambulatory Visit (INDEPENDENT_AMBULATORY_CARE_PROVIDER_SITE_OTHER): Payer: Medicare Other | Admitting: Family Medicine

## 2023-09-16 VITALS — BP 120/76 | HR 68 | Temp 98.1°F | Ht 62.0 in | Wt 135.2 lb

## 2023-09-16 DIAGNOSIS — R300 Dysuria: Secondary | ICD-10-CM | POA: Diagnosis not present

## 2023-09-16 DIAGNOSIS — R7309 Other abnormal glucose: Secondary | ICD-10-CM | POA: Insufficient documentation

## 2023-09-16 DIAGNOSIS — Z87891 Personal history of nicotine dependence: Secondary | ICD-10-CM

## 2023-09-16 DIAGNOSIS — Z23 Encounter for immunization: Secondary | ICD-10-CM

## 2023-09-16 DIAGNOSIS — Z79899 Other long term (current) drug therapy: Secondary | ICD-10-CM

## 2023-09-16 DIAGNOSIS — J029 Acute pharyngitis, unspecified: Secondary | ICD-10-CM

## 2023-09-16 DIAGNOSIS — M545 Low back pain, unspecified: Secondary | ICD-10-CM | POA: Diagnosis not present

## 2023-09-16 LAB — URINALYSIS, ROUTINE W REFLEX MICROSCOPIC
Bilirubin Urine: NEGATIVE
Casts: NONE SEEN /[LPF]
Crystals: NONE SEEN /[HPF]
Glucose, UA: NEGATIVE
Hgb urine dipstick: NEGATIVE
Ketones, ur: NEGATIVE
Nitrite: NEGATIVE
RBC / HPF: NONE SEEN /[HPF] (ref 0–2)
Specific Gravity, Urine: 1.02 (ref 1.001–1.035)
Yeast: NONE SEEN /[HPF]
pH: 7 (ref 5.0–8.0)

## 2023-09-16 LAB — MICROSCOPIC MESSAGE
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
## 2023-09-17 LAB — URINE CULTURE
MICRO NUMBER:: 16075166
Result:: NO GROWTH
SPECIMEN QUALITY:: ADEQUATE

## 2023-09-17 LAB — HEMOGLOBIN A1C
Hgb A1c MFr Bld: 5.5 %{Hb} (ref <5.7)
Mean Plasma Glucose: 111 mg/dL
eAG (mmol/L): 6.2 mmol/L

## 2023-09-17 LAB — COMPLETE METABOLIC PANEL WITH GFR
AG Ratio: 1.9 (calc) (ref 1.0–2.5)
ALT: 20 U/L (ref 6–29)
AST: 26 U/L (ref 10–35)
Albumin: 4.3 g/dL (ref 3.6–5.1)
Alkaline phosphatase (APISO): 77 U/L (ref 37–153)
BUN: 7 mg/dL (ref 7–25)
CO2: 29 mmol/L (ref 20–32)
Calcium: 9.3 mg/dL (ref 8.6–10.4)
Chloride: 103 mmol/L (ref 98–110)
Creat: 0.79 mg/dL (ref 0.50–1.05)
Globulin: 2.3 g/dL (ref 1.9–3.7)
Glucose, Bld: 87 mg/dL (ref 65–99)
Potassium: 4.3 mmol/L (ref 3.5–5.3)
Sodium: 139 mmol/L (ref 135–146)
Total Bilirubin: 0.5 mg/dL (ref 0.2–1.2)
Total Protein: 6.6 g/dL (ref 6.1–8.1)
eGFR: 83 mL/min/{1.73_m2} (ref >=60)

## 2023-09-18 ENCOUNTER — Encounter: Payer: Self-pay | Admitting: Family Medicine

## 2023-10-05 ENCOUNTER — Telehealth: Payer: Self-pay | Admitting: Family Medicine

## 2023-10-15 ENCOUNTER — Other Ambulatory Visit: Payer: Self-pay | Admitting: Family Medicine

## 2023-10-15 DIAGNOSIS — E7800 Pure hypercholesterolemia, unspecified: Secondary | ICD-10-CM

## 2023-10-15 MED ORDER — ROSUVASTATIN CALCIUM 20 MG PO TABS: 20.0000 mg | ORAL_TABLET | Freq: Every day | ORAL | 0 refills | Status: DC

## 2023-10-15 NOTE — Telephone Encounter (Signed)
 Copied from CRM [id number]. Topic: Clinical - Medication Refill >> Oct 15, 2023  3:49 PM Gildardo Pounds wrote: Most Recent Primary Care Visit:  Provider: Bernadette Hoit  Department: [facility]  Visit Type: ACUTE  Date: 09/16/2023  Medication: rosuvastatin (CRESTOR) 20 MG tablet  Has the patient contacted their pharmacy? Yes (Agent: If no, request that the patient contact the pharmacy for the refill. If patient does not wish to contact the pharmacy document the reason why and proceed with request.) (Agent: If yes, when and what did the pharmacy advise?)  Is this the correct pharmacy for this prescription? Yes If no, delete pharmacy and type the correct one.  This is the patient's preferred pharmacy:  [id number] [place], [place] - [address] [place] [address] Phone: [phone] Fax: [phone]   Has the prescription been filled recently? No  Is the patient out of the medication? Yes  Has the patient been seen for an appointment in the last year OR does the patient have an upcoming appointment? Yes  Can we respond through MyChart? Yes  Agent: Please be advised that Rx refills may take up to 3 business days. We ask that you follow-up with your pharmacy.

## 2023-10-29 ENCOUNTER — Encounter: Payer: Self-pay | Admitting: Family Medicine

## 2023-10-29 ENCOUNTER — Ambulatory Visit: Admitting: Family Medicine

## 2023-10-29 VITALS — BP 114/68 | HR 58 | Temp 98.5°F | Ht 62.0 in | Wt 135.0 lb

## 2023-10-29 DIAGNOSIS — Z23 Encounter for immunization: Secondary | ICD-10-CM | POA: Diagnosis not present

## 2023-10-29 DIAGNOSIS — Z1231 Encounter for screening mammogram for malignant neoplasm of breast: Secondary | ICD-10-CM

## 2023-10-29 DIAGNOSIS — Z860101 Personal history of adenomatous and serrated colon polyps: Secondary | ICD-10-CM | POA: Diagnosis not present

## 2023-10-29 DIAGNOSIS — E785 Hyperlipidemia, unspecified: Secondary | ICD-10-CM | POA: Diagnosis not present

## 2023-10-29 DIAGNOSIS — Z79899 Other long term (current) drug therapy: Secondary | ICD-10-CM | POA: Diagnosis not present

## 2023-10-29 NOTE — Assessment & Plan Note (Addendum)
 Needs repeat in 2026 per [facility]
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
## 2023-10-30 ENCOUNTER — Encounter: Payer: Self-pay | Admitting: Family Medicine

## 2023-10-30 LAB — LIPID PANEL
Cholesterol: 168 mg/dL (ref <200)
HDL: 60 mg/dL (ref >=50)
LDL Cholesterol (Calc): 85 mg/dL
Non-HDL Cholesterol (Calc): 108 mg/dL (ref <130)
Total CHOL/HDL Ratio: 2.8 (calc) (ref <5.0)
Triglycerides: 132 mg/dL (ref <150)

## 2023-11-02 ENCOUNTER — Ambulatory Visit (INDEPENDENT_AMBULATORY_CARE_PROVIDER_SITE_OTHER): Admitting: Family Medicine

## 2023-11-02 VITALS — BP 110/64 | HR 67 | Temp 98.1°F | Ht 62.0 in | Wt 135.4 lb

## 2023-11-02 DIAGNOSIS — F3162 Bipolar disorder, current episode mixed, moderate: Secondary | ICD-10-CM

## 2023-11-02 DIAGNOSIS — Z860101 Personal history of adenomatous and serrated colon polyps: Secondary | ICD-10-CM | POA: Diagnosis not present

## 2023-11-02 DIAGNOSIS — Z01411 Encounter for gynecological examination (general) (routine) with abnormal findings: Secondary | ICD-10-CM

## 2023-11-02 DIAGNOSIS — Z1231 Encounter for screening mammogram for malignant neoplasm of breast: Secondary | ICD-10-CM

## 2023-11-02 DIAGNOSIS — Z0000 Encounter for general adult medical examination without abnormal findings: Secondary | ICD-10-CM

## 2023-11-02 DIAGNOSIS — Z23 Encounter for immunization: Secondary | ICD-10-CM

## 2023-11-02 DIAGNOSIS — E785 Hyperlipidemia, unspecified: Secondary | ICD-10-CM
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
## 2023-12-03 ENCOUNTER — Encounter: Payer: Self-pay | Admitting: Family Medicine

## 2023-12-03 NOTE — Addendum Note (Signed)
 Addended by: Gillermo Lack K on: 12/03/2023 08:40 AM   Modules accepted: Orders

## 2024-01-15 ENCOUNTER — Other Ambulatory Visit: Payer: Self-pay | Admitting: Family Medicine

## 2024-01-15 DIAGNOSIS — E7800 Pure hypercholesterolemia, unspecified: Secondary | ICD-10-CM

## 2024-02-03 ENCOUNTER — Encounter: Payer: Self-pay | Admitting: Emergency Medicine

## 2024-02-03 ENCOUNTER — Ambulatory Visit
Admission: EM | Admit: 2024-02-03 | Discharge: 2024-02-03 | Disposition: A | Discharge status: Home / Self Care | Attending: Family Medicine | Admitting: Family Medicine

## 2024-02-03 DIAGNOSIS — S61412A Laceration without foreign body of left hand, initial encounter: Secondary | ICD-10-CM

## 2024-02-03 MED ORDER — MUPIROCIN 2 % EX OINT: 1.0000 | TOPICAL_OINTMENT | Freq: Two times a day (BID) | CUTANEOUS | 0 refills | Status: AC

## 2024-02-03 MED ORDER — MUPIROCIN 2 % EX OINT: TOPICAL_OINTMENT | Freq: Two times a day (BID) | CUTANEOUS | Status: DC

## 2024-02-03 MED ORDER — CHLORHEXIDINE GLUCONATE 4 % EX SOLN: Freq: Every day | CUTANEOUS | 0 refills | Status: AC | PRN

## 2024-02-03 MED ORDER — BACITRACIN 500 UNIT/GM EX OINT
1.0000 | TOPICAL_OINTMENT | Freq: Two times a day (BID) | CUTANEOUS | Status: DC
  Administered 2024-02-03: 1 via TOPICAL

## 2024-02-03 MED ORDER — CEPHALEXIN 500 MG PO CAPS
500.0000 mg | ORAL_CAPSULE | Freq: Two times a day (BID) | ORAL | 0 refills | Status: AC
Start: 2024-02-03 — End: ?

## 2024-02-03 NOTE — ED Provider Notes (Signed)
 [facility]    CSN: [id number] Arrival date & time: 02/03/24  1452      History   Chief Complaint No chief complaint on file.   HPI Natalie [name] is a 63 y.o. female.   Patient presenting today with a laceration to the palm of left hand that occurred while cutting an avocado today.  She states the knife jabbed into her hand.  Bleeding well-controlled after pressure applied, range of motion to the hand intact.  Some numbness and tingling to the left ring finger per patient.  Not currently on any anticoagulation.  Last tetanus shot in 2023.    Past Medical History:  Diagnosis Date   Allergy    seasonal   Anxiety    bipolar   Bipolar 1 disorder (HCC)    Cataract    Depression    Glaucoma    stage 2   History of IBS    IBS- D   Hyperlipidemia    Smoker     Patient Active Problem List   Diagnosis Date Noted   Need for 23-polyvalent pneumococcal polysaccharide vaccine 10/29/2023   Dysuria 09/16/2023   Elevated glucose 09/16/2023   Taking a statin medication 09/16/2023   Diarrhea 01/14/2021   Elevated LFTs 01/14/2021   H/O adenomatous polyp of colon 01/14/2021   Visit for screening mammogram 01/08/2014   Confusion 01/08/2014   Bipolar disorder with severe mania (HCC) 01/08/2014   Allergy    Anxiety    Bipolar disorder with severe depression (HCC)    Former smoker 10/20/2013   Bipolar disorder, unspecified (HCC) 10/20/2013   Glaucoma 10/20/2013   Hyperlipidemia     Past Surgical History:  Procedure Laterality Date   COLONOSCOPY  2012   Dr. Debrah; 1 tubular adenoma.   COSMETIC SURGERY  08/04/1965   face after accident   EYELID LACERATION REPAIR     POLYPECTOMY     TUBAL LIGATION  08/05/1991    OB History   No obstetric history on file.      Home Medications    Prior to Admission medications   Medication Sig Start Date End Date Taking? Authorizing Provider  cephALEXin (KEFLEX) 500 MG capsule Take 1 capsule (500 mg total) by mouth 2 (two)  times daily. 02/03/24  Yes Stuart Vernell Norris, PA-C  chlorhexidine (HIBICLENS) 4 % external liquid Apply topically daily as needed. 02/03/24  Yes Stuart Vernell Norris, PA-C  mupirocin ointment (BACTROBAN) 2 % Apply 1 Application topically 2 (two) times daily. 02/03/24  Yes Stuart Vernell Norris, PA-C  lamoTRIgine  (LAMICTAL ) 100 MG tablet Take 1 tablet (100 mg total) by mouth every morning. Patient taking differently: Take 100 mg by mouth 2 (two) times daily. 03/16/14   Withrow, Norleen BROCKS, FNP  Multiple Vitamins-Minerals (CENTRUM SILVER  50+WOMEN PO) Take 1 tablet by mouth daily.    [provider]  PARoxetine  (PAXIL ) 20 MG tablet Take 1 tablet (20 mg total) by mouth daily. 03/16/14   Withrow, Norleen BROCKS, FNP  QUEtiapine  (SEROQUEL ) 100 MG tablet Take 100 mg by mouth at bedtime.    [provider]  rosuvastatin  (CRESTOR ) 20 MG tablet TAKE 1 TABLET BY MOUTH EVERY DAY 01/18/24   Duanne Butler DASEN, MD    Family History Family History  Problem Relation Age of Onset   Colon polyps Father    Hypertension Father    Hyperlipidemia Father    Colon polyps Sister    Prostate cancer Maternal Grandfather        dx'd  in his 70's   Cancer Neg Hx    Heart disease Neg Hx    Stomach cancer Neg Hx    Rectal cancer Neg Hx    Breast cancer Neg Hx     Social History Social History   Tobacco Use   Smoking status: Former    Types: Cigarettes   Smokeless tobacco: Never   Tobacco comments:    quit 03/2014 and quit 12-02-2021  Substance Use Topics   Alcohol use: No    Alcohol/week: 0.0 standard drinks of alcohol   Drug use: No     Allergies   Sulfa antibiotics, Aspirin, Codeine, Hydrocodone , Mobic  [meloxicam ], and Topamax  [topiramate ]   Review of Systems Review of Systems Per HPI  Physical Exam Triage Vital Signs ED Triage Vitals  Encounter Vitals Group     BP 02/03/24 1508 (!) 113/57     Girls Systolic BP Percentile --      Girls Diastolic BP Percentile --      Boys Systolic BP  Percentile --      Boys Diastolic BP Percentile --      Pulse Rate 02/03/24 1508 75     Resp 02/03/24 1508 18     Temp 02/03/24 1508 98 F (36.7 C)     Temp Source 02/03/24 1508 Oral     SpO2 02/03/24 1508 98 %     Weight --      Height --      Head Circumference --      Peak Flow --      Pain Score 02/03/24 1510 0     Pain Loc --      Pain Education --      Exclude from Growth Chart --    No data found.  Updated Vital Signs BP (!) 113/57 (BP Location: Right Arm)   Pulse 75   Temp 98 F (36.7 C) (Oral)   Resp 18   LMP 11/16/2009   SpO2 98%   Visual Acuity Right Eye Distance:   Left Eye Distance:   Bilateral Distance:    Right Eye Near:   Left Eye Near:    Bilateral Near:     Physical Exam Vitals and nursing note reviewed.  Constitutional:      Appearance: Normal appearance. She is not ill-appearing.  HENT:     Head: Atraumatic.  Eyes:     Extraocular Movements: Extraocular movements intact.     Conjunctiva/sclera: Conjunctivae normal.  Cardiovascular:     Rate and Rhythm: Normal rate.  Pulmonary:     Effort: Pulmonary effort is normal.  Musculoskeletal:        General: Normal range of motion.     Cervical back: Normal range of motion and neck supple.     Comments: Range of motion of the left hand intact, no bony deformity palpable  Skin:    General: Skin is warm.     Findings: Bruising present.     Comments: 1 cm fairly well-approximated laceration to the palm of left hand just below the base of fourth finger, bleeding well-controlled, no foreign body appreciable within wound. Does have some appreciable bruising to the area surrounding the laceration to the fourth MCP  Neurological:     Mental Status: She is alert and oriented to person, place, and time.     Comments: Left hand neurovascularly intact, good capillary refill in the fingers of the left hand  Psychiatric:        Mood and Affect: Mood  normal.        Thought Content: Thought content normal.         Judgment: Judgment normal.      UC Treatments / Results  Labs (all labs ordered are listed, but only abnormal results are displayed) Labs Reviewed - No data to display  EKG   Radiology No results found.  Procedures Procedures (including critical care time)  Medications Ordered in UC Medications  mupirocin ointment (BACTROBAN) 2 % (has no administration in time range)  bacitracin ointment 1 Application (1 Application Topical Given 02/03/24 1534)    Initial Impression / Assessment and Plan / UC Course  I have reviewed the triage vital signs and the nursing notes.  Pertinent labs & imaging results that were available during my care of the patient were reviewed by me and considered in my medical decision making (see chart for details).     Exam reassuring today, no evidence of bony or tendon/ligament injury and x-ray imaging deferred with shared decision making.  Bleeding is well-controlled.  Area cleaned thoroughly with Hibiclens, mupirocin ointment and a nonstick pressure dressing were applied.  Will defer any sort of closure at this time to promote better ability for wound care at home.  Will treat with Keflex, Hibiclens, mupirocin, good home wound care.  Follow-up with PCP within 1 week.  Final Clinical Impressions(s) / UC Diagnoses   Final diagnoses:  Laceration of left hand without foreign body, initial encounter     Discharge Instructions      Take the full of antibiotics prescribed and clean the area once to twice daily with the Hibiclens solution, apply the mupirocin ointment and a nonstick gauze pad and wrapped in Coban.  Elevate the hand at rest to help with swelling, over-the-counter pain relievers as needed.  Follow-up with your primary care provider for a recheck in about a week, sooner for worsening symptoms    ED Prescriptions     Medication Sig Dispense Auth. Provider   cephALEXin (KEFLEX) 500 MG capsule Take 1 capsule (500 mg total) by mouth 2  (two) times daily. 14 capsule Stuart Vernell Norris, PA-C   chlorhexidine (HIBICLENS) 4 % external liquid Apply topically daily as needed. 236 mL Stuart Vernell Norris, PA-C   mupirocin ointment (BACTROBAN) 2 % Apply 1 Application topically 2 (two) times daily. 60 g Stuart Vernell Norris, [place]      PDMP not reviewed this encounter.   Stuart Vernell Norris, [place] 02/03/24 1541

## 2024-02-03 NOTE — ED Triage Notes (Addendum)
 Cut to left hand while cutting an avocado today.  Small cut below left ring finger.  Bleeding controlled.  Last tetanus shot was in 2023

## 2024-02-03 NOTE — Discharge Instructions (Addendum)
 Take the full of antibiotics prescribed and clean the area once to twice daily with the Hibiclens solution, apply the mupirocin ointment and a nonstick gauze pad and wrapped in Coban.  Elevate the hand at rest to help with swelling, over-the-counter pain relievers as needed.  Follow-up with your primary care provider for a recheck in about a week, sooner for worsening symptoms

## 2024-04-06 ENCOUNTER — Ambulatory Visit (INDEPENDENT_AMBULATORY_CARE_PROVIDER_SITE_OTHER): Admitting: Family Medicine

## 2024-04-06 ENCOUNTER — Encounter: Payer: Self-pay | Admitting: Family Medicine

## 2024-04-06 VITALS — BP 125/68 | HR 61 | Temp 97.7°F | Ht 62.0 in | Wt 149.0 lb

## 2024-04-06 DIAGNOSIS — J0110 Acute frontal sinusitis, unspecified: Secondary | ICD-10-CM

## 2024-04-06 NOTE — Progress Notes (Signed)
 Subjective:  HPI: Natalie [name] is a 63 y.o. female presenting on 04/06/2024 for Acute Visit (Fever, cough, poison ivy on legs /Pt reports low grade fever, sinus pressure w/ headache and ear pain since Saturday. Was working in the yard and has had leg rash since Saturday as well .)   HPI Patient is in today for cough, sinus pressure, headache, ear pressure, itching eyes for 2 days. Denies fever, chills, body aches, SOB, wheezing, pleurisy, N/V/D, sore throat. No known exposures or home flu/covid test Has tried benadryl  once. She was working in her yard around the time this started and also has some itching bug bites scattered on her legs. She was wearing shorts. Has tried alcohol swabs to relieve the itching.    Review of Systems  All other systems reviewed and are negative.   Relevant past medical history reviewed and updated as indicated.   Past Medical History:  Diagnosis Date   Allergy    seasonal   Anxiety    bipolar   Bipolar 1 disorder (HCC)    Cataract    Depression    Glaucoma    stage 2   History of IBS    IBS- D   Hyperlipidemia    Smoker      Past Surgical History:  Procedure Laterality Date   COLONOSCOPY  2012   Dr. Debrah; 1 tubular adenoma.   COSMETIC SURGERY  08/04/1965   face after accident   EYELID LACERATION REPAIR     POLYPECTOMY     TUBAL LIGATION  08/05/1991    Allergies and medications reviewed and updated.   Current Outpatient Medications:    lamoTRIgine  (LAMICTAL ) 100 MG tablet, Take 1 tablet (100 mg total) by mouth every morning., Disp: 30 tablet, Rfl: 0   Multiple Vitamins-Minerals (CENTRUM SILVER  50+WOMEN PO), Take 1 tablet by mouth daily., Disp: , Rfl:    PARoxetine  (PAXIL ) 20 MG tablet, Take 1 tablet (20 mg total) by mouth daily., Disp: , Rfl:    QUEtiapine  (SEROQUEL ) 100 MG tablet, Take 100 mg by mouth at bedtime., Disp: , Rfl:    cephALEXin  (KEFLEX ) 500 MG capsule, Take 1 capsule (500 mg total) by mouth 2 (two) times daily., Disp:  14 capsule, Rfl: 0   chlorhexidine  (HIBICLENS ) 4 % external liquid, Apply topically daily as needed., Disp: 236 mL, Rfl: 0   mupirocin  ointment (BACTROBAN ) 2 %, Apply 1 Application topically 2 (two) times daily., Disp: 60 g, Rfl: 0   rosuvastatin  (CRESTOR ) 20 MG tablet, TAKE 1 TABLET BY MOUTH EVERY DAY, Disp: 90 tablet, Rfl: 0  Allergies  Allergen Reactions   Sulfa Antibiotics Anaphylaxis and Swelling     flu like sx   Aspirin Other (See Comments)    Has ulcers   Codeine Nausea And Vomiting and Other (See Comments)    Stomach pain   Hydrocodone  Nausea And Vomiting   Mobic  [Meloxicam ] Nausea And Vomiting   Topamax  [Topiramate ] Other (See Comments)    Dizziness, Weird taste in mouth,sick feeling    Objective:   BP 125/68   Pulse 61   Temp 97.7 F (36.5 C)   Ht 5' 2 (1.575 m)   Wt 149 lb (67.6 kg)   LMP 11/16/2009   SpO2 95%   BMI 27.25 kg/m      04/06/2024   10:16 AM 02/03/2024    3:08 PM 11/02/2023   11:48 AM  Vitals with BMI  Height 5' 2  5' 2  Weight 149 lbs  135  lbs 6 oz  BMI 27.25  24.76  Systolic 125 113 889  Diastolic 68 57 64  Pulse 61 75 67     Physical Exam Vitals and nursing note reviewed.  Constitutional:      Appearance: Normal appearance. She is normal weight.  HENT:     Head: Normocephalic and atraumatic.     Right Ear: Tympanic membrane, ear canal and external ear normal.     Left Ear: Tympanic membrane, ear canal and external ear normal.     Nose:     Right Sinus: Frontal sinus tenderness present.     Left Sinus: Frontal sinus tenderness present.     Mouth/Throat:     Mouth: Mucous membranes are moist.     Pharynx: Oropharynx is clear.  Eyes:     Extraocular Movements: Extraocular movements intact.     Conjunctiva/sclera: Conjunctivae normal.     Pupils: Pupils are equal, round, and reactive to light.  Cardiovascular:     Rate and Rhythm: Normal rate and regular rhythm.     Pulses: Normal pulses.     Heart sounds: Normal heart sounds.   Pulmonary:     Effort: Pulmonary effort is normal.     Breath sounds: Normal breath sounds.  Musculoskeletal:     Cervical back: No tenderness.  Lymphadenopathy:     Cervical: No cervical adenopathy.  Skin:    General: Skin is warm and dry.  Neurological:     General: No focal deficit present.     Mental Status: She is alert and oriented to person, place, and time. Mental status is at baseline.  Psychiatric:        Mood and Affect: Mood normal.        Behavior: Behavior normal.        Thought Content: Thought content normal.        Judgment: Judgment normal.     Assessment & Plan:  Acute non-recurrent frontal sinusitis Assessment & Plan: Pt declined viral testing however symptoms point more toward allergies and sinusitis. Cannot tolerate antihistamines such as Zyrtec or Claritin due to jittery feeling per pt. Encouraged symptomatic management with acetaminophen  as needed for fever/pain. Encouraged salt water  gargling, chloraseptic spray and throat lozenges. Encouraged OTC guaifenesin. Encouraged saline sinus flushes and/or neti with humidified air. Discussed expected course. Return to office if symptoms persist or worsen.        Follow up plan: Return if symptoms worsen or fail to improve.  Jeoffrey GORMAN Barrio, FNP

## 2024-04-06 NOTE — Assessment & Plan Note (Signed)
 Pt declined viral testing however symptoms point more toward allergies and sinusitis. Cannot tolerate antihistamines such as Zyrtec or Claritin due to jittery feeling per pt. Encouraged symptomatic management with acetaminophen  as needed for fever/pain. Encouraged salt water  gargling, chloraseptic spray and throat lozenges. Encouraged OTC guaifenesin. Encouraged saline sinus flushes and/or neti with humidified air. Discussed expected course. Return to office if symptoms persist or worsen.

## 2024-04-11 ENCOUNTER — Other Ambulatory Visit: Payer: Self-pay | Admitting: Family Medicine

## 2024-04-11 DIAGNOSIS — E7800 Pure hypercholesterolemia, unspecified: Secondary | ICD-10-CM

## 2024-05-23 ENCOUNTER — Ambulatory Visit (HOSPITAL_COMMUNITY)
Admission: RE | Admit: 2024-05-23 | Discharge: 2024-05-23 | Disposition: A | Discharge status: Home / Self Care | Source: Ambulatory Visit | Attending: Family Medicine | Admitting: Family Medicine

## 2024-05-23 ENCOUNTER — Encounter (HOSPITAL_COMMUNITY): Payer: Self-pay

## 2024-05-23 DIAGNOSIS — Z1231 Encounter for screening mammogram for malignant neoplasm of breast: Secondary | ICD-10-CM | POA: Diagnosis present

## 2024-06-27 ENCOUNTER — Other Ambulatory Visit: Payer: Self-pay

## 2024-06-27 ENCOUNTER — Telehealth: Payer: Self-pay

## 2024-06-27 DIAGNOSIS — E7800 Pure hypercholesterolemia, unspecified: Secondary | ICD-10-CM

## 2024-06-27 MED ORDER — ROSUVASTATIN CALCIUM 20 MG PO TABS: 20.0000 mg | ORAL_TABLET | Freq: Every day | ORAL | 0 refills | Status: AC

## 2024-06-27 NOTE — Telephone Encounter (Signed)
 Prescription Request  06/27/2024  LOV: 04/06/24  What is the name of the medication or equipment? rosuvastatin  (CRESTOR ) 20 MG tablet [[id number]]   Have you contacted your pharmacy to request a refill? Yes   Which pharmacy would you like this sent to?  [id number] [address] [place], [place] - [address] [place]  [address] Phone: [phone] Fax: [phone]    Patient notified that their request is being sent to the clinical staff for review and that they should receive a response within 2 business days.   Please advise at [facility]

## 2024-06-27 NOTE — Telephone Encounter (Signed)
 Sent in medication
